# Patient Record
Sex: Female | Born: 1980 | Race: White | Hispanic: Yes | Marital: Married | Smoking: Never smoker
Health system: Southern US, Community
[De-identification: ages and names within clinical notes are randomized; demographics above are authoritative.]

## PROBLEM LIST (undated history)

## (undated) ENCOUNTER — Emergency Department (HOSPITAL_COMMUNITY): Payer: Medicaid Other

## (undated) DIAGNOSIS — K3580 Unspecified acute appendicitis: Secondary | ICD-10-CM

## (undated) DIAGNOSIS — O24419 Gestational diabetes mellitus in pregnancy, unspecified control: Secondary | ICD-10-CM

## (undated) HISTORY — PX: APPENDECTOMY: SHX54

## (undated) HISTORY — DX: Gestational diabetes mellitus in pregnancy, unspecified control: O24.419

---

## 2001-09-19 ENCOUNTER — Emergency Department (HOSPITAL_COMMUNITY): Admission: EM | Admit: 2001-09-19 | Discharge: 2001-09-19 | Payer: Self-pay | Admitting: Emergency Medicine

## 2002-08-19 ENCOUNTER — Other Ambulatory Visit: Admission: RE | Admit: 2002-08-19 | Discharge: 2002-08-19 | Payer: Self-pay | Admitting: Obstetrics and Gynecology

## 2002-08-25 ENCOUNTER — Encounter: Payer: Self-pay | Admitting: *Deleted

## 2002-08-25 ENCOUNTER — Ambulatory Visit (HOSPITAL_COMMUNITY): Admission: RE | Admit: 2002-08-25 | Discharge: 2002-08-25 | Payer: Self-pay | Admitting: *Deleted

## 2002-12-08 ENCOUNTER — Inpatient Hospital Stay (HOSPITAL_COMMUNITY): Admission: AD | Admit: 2002-12-08 | Discharge: 2002-12-10 | Payer: Self-pay | Admitting: *Deleted

## 2005-06-27 ENCOUNTER — Ambulatory Visit (HOSPITAL_COMMUNITY): Admission: RE | Admit: 2005-06-27 | Discharge: 2005-06-27 | Payer: Self-pay | Admitting: *Deleted

## 2005-10-30 ENCOUNTER — Inpatient Hospital Stay (HOSPITAL_COMMUNITY): Admission: AD | Admit: 2005-10-30 | Discharge: 2005-10-30 | Payer: Self-pay | Admitting: *Deleted

## 2005-10-30 ENCOUNTER — Ambulatory Visit: Payer: Self-pay | Admitting: Family Medicine

## 2005-10-31 ENCOUNTER — Ambulatory Visit: Payer: Self-pay | Admitting: Gynecology

## 2005-10-31 ENCOUNTER — Inpatient Hospital Stay (HOSPITAL_COMMUNITY): Admission: AD | Admit: 2005-10-31 | Discharge: 2005-11-02 | Payer: Self-pay | Admitting: Family Medicine

## 2008-11-03 ENCOUNTER — Emergency Department (HOSPITAL_COMMUNITY): Admission: EM | Admit: 2008-11-03 | Discharge: 2008-11-05 | Payer: Self-pay | Admitting: Emergency Medicine

## 2010-02-26 NOTE — L&D Delivery Note (Signed)
Delivery Note At 11:49 AM a viable female was delivered via  (Presentation: ;  ).  APGAR: , ; weight .   Placenta status: , .  Cord:  with the following complications: .  Cord pH: not done  Anesthesia:   Episiotomy:  Lacerations:  Suture Repair: 2.0 Est. Blood Loss (mL):   Mom to postpartum.  Baby to nursery-stable.  MARSHALL,BERNARD A 02/02/2011, 11:58 AM

## 2010-06-02 LAB — CBC
Platelets: 224 10*3/uL (ref 150–400)
WBC: 15 10*3/uL — ABNORMAL HIGH (ref 4.0–10.5)

## 2010-06-02 LAB — URINE MICROSCOPIC-ADD ON

## 2010-06-02 LAB — COMPREHENSIVE METABOLIC PANEL
AST: 32 U/L (ref 0–37)
Albumin: 4.4 g/dL (ref 3.5–5.2)
Alkaline Phosphatase: 61 U/L (ref 39–117)
Chloride: 106 mEq/L (ref 96–112)
Creatinine, Ser: 0.63 mg/dL (ref 0.4–1.2)
GFR calc Af Amer: >60 mL/min (ref >60)
Potassium: 3.8 mEq/L (ref 3.5–5.1)
Total Bilirubin: 0.8 mg/dL (ref 0.3–1.2)

## 2010-06-02 LAB — DIFFERENTIAL
Basophils Absolute: 0.1 10*3/uL (ref 0.0–0.1)
Eosinophils Relative: 0 % (ref 0–5)
Lymphocytes Relative: 6 % — ABNORMAL LOW (ref 12–46)
Monocytes Absolute: 0.3 10*3/uL (ref 0.1–1.0)

## 2010-06-02 LAB — URINALYSIS, ROUTINE W REFLEX MICROSCOPIC
Ketones, ur: >80 mg/dL — AB
Nitrite: NEGATIVE
Specific Gravity, Urine: 1.023 (ref 1.005–1.030)

## 2010-06-02 LAB — POCT PREGNANCY, URINE: Preg Test, Ur: NEGATIVE

## 2010-06-14 ENCOUNTER — Inpatient Hospital Stay (HOSPITAL_COMMUNITY): Payer: Self-pay

## 2010-06-14 ENCOUNTER — Inpatient Hospital Stay (HOSPITAL_COMMUNITY)
Admission: AD | Admit: 2010-06-14 | Discharge: 2010-06-14 | Disposition: A | Discharge status: Home / Self Care | Payer: Self-pay | Source: Ambulatory Visit | Attending: Obstetrics & Gynecology | Admitting: Obstetrics & Gynecology

## 2010-06-14 ENCOUNTER — Encounter (HOSPITAL_COMMUNITY): Payer: Self-pay | Admitting: Radiology

## 2010-06-14 DIAGNOSIS — O209 Hemorrhage in early pregnancy, unspecified: Secondary | ICD-10-CM

## 2010-06-14 LAB — URINALYSIS, ROUTINE W REFLEX MICROSCOPIC
Glucose, UA: NEGATIVE mg/dL
Protein, ur: NEGATIVE mg/dL
Specific Gravity, Urine: <1.005 — ABNORMAL LOW (ref 1.005–1.030)
pH: 7 (ref 5.0–8.0)

## 2010-06-14 LAB — WET PREP, GENITAL
Clue Cells Wet Prep HPF POC: NONE SEEN
Trich, Wet Prep: NONE SEEN
Yeast Wet Prep HPF POC: NONE SEEN

## 2010-06-14 LAB — URINE MICROSCOPIC-ADD ON

## 2010-06-14 LAB — CBC
Platelets: 225 10*3/uL (ref 150–400)
RBC: 4.35 MIL/uL (ref 3.87–5.11)
WBC: 9.3 10*3/uL (ref 4.0–10.5)

## 2010-09-08 LAB — RPR: RPR: NONREACTIVE

## 2010-09-08 LAB — HEPATITIS B SURFACE ANTIGEN: Hepatitis B Surface Ag: NEGATIVE

## 2010-09-08 LAB — RUBELLA ANTIBODY, IGM: Rubella: IMMUNE

## 2011-02-02 ENCOUNTER — Encounter (HOSPITAL_COMMUNITY): Payer: Self-pay | Admitting: *Deleted

## 2011-02-02 ENCOUNTER — Inpatient Hospital Stay (HOSPITAL_COMMUNITY)
Admission: AD | Admit: 2011-02-02 | Discharge: 2011-02-04 | DRG: 775 | Disposition: A | Discharge status: Home / Self Care | Payer: Medicaid Other | Source: Ambulatory Visit | Attending: Obstetrics | Admitting: Obstetrics

## 2011-02-02 ENCOUNTER — Encounter (HOSPITAL_COMMUNITY): Payer: Self-pay

## 2011-02-02 ENCOUNTER — Encounter (HOSPITAL_COMMUNITY): Payer: Self-pay | Admitting: Anesthesiology

## 2011-02-02 ENCOUNTER — Telehealth (HOSPITAL_COMMUNITY): Payer: Self-pay | Admitting: *Deleted

## 2011-02-02 ENCOUNTER — Inpatient Hospital Stay (HOSPITAL_COMMUNITY): Admission: RE | Admit: 2011-02-02 | Payer: Self-pay | Source: Ambulatory Visit

## 2011-02-02 ENCOUNTER — Inpatient Hospital Stay (HOSPITAL_COMMUNITY): Payer: Medicaid Other | Admitting: Anesthesiology

## 2011-02-02 LAB — RPR: RPR Ser Ql: NONREACTIVE

## 2011-02-02 LAB — COMPREHENSIVE METABOLIC PANEL
ALT: 17 U/L (ref 0–35)
AST: 22 U/L (ref 0–37)
Albumin: 2.7 g/dL — ABNORMAL LOW (ref 3.5–5.2)
CO2: 20 mEq/L (ref 19–32)
Calcium: 9.1 mg/dL (ref 8.4–10.5)
GFR calc non Af Amer: >90 mL/min (ref >90)
Sodium: 137 mEq/L (ref 135–145)
Total Protein: 6.4 g/dL (ref 6.0–8.3)

## 2011-02-02 LAB — CBC
Hemoglobin: 11.6 g/dL — ABNORMAL LOW (ref 12.0–15.0)
MCHC: 32.3 g/dL (ref 30.0–36.0)
Platelets: 234 10*3/uL (ref 150–400)

## 2011-02-02 MED ORDER — LACTATED RINGERS IV SOLN
500.0000 mL | INTRAVENOUS | Status: DC | PRN
Start: 2011-02-02 — End: 2011-02-02

## 2011-02-02 MED ORDER — LANOLIN HYDROUS EX OINT: TOPICAL_OINTMENT | CUTANEOUS | Status: DC | PRN

## 2011-02-02 MED ORDER — LIDOCAINE HCL (PF) 1 % IJ SOLN
INTRAMUSCULAR | Status: AC
  Filled 2011-02-02: qty 30

## 2011-02-02 MED ORDER — SODIUM CHLORIDE 0.9 % IV SOLN
2.0000 g | Freq: Once | INTRAVENOUS | Status: DC
  Filled 2011-02-02: qty 2000

## 2011-02-02 MED ORDER — BENZOCAINE-MENTHOL 20-0.5 % EX AERO: 1.0000 "application " | INHALATION_SPRAY | CUTANEOUS | Status: DC | PRN

## 2011-02-02 MED ORDER — INFLUENZA VIRUS VACC SPLIT PF IM SUSP
0.5000 mL | INTRAMUSCULAR | Status: AC
  Administered 2011-02-03: 0.5 mL via INTRAMUSCULAR
  Filled 2011-02-02: qty 0.5

## 2011-02-02 MED ORDER — OXYTOCIN 20 UNITS IN LACTATED RINGERS INFUSION - SIMPLE
125.0000 mL/h | Freq: Once | INTRAVENOUS | Status: AC
  Administered 2011-02-02: 125 mL/h via INTRAVENOUS

## 2011-02-02 MED ORDER — PHENYLEPHRINE 40 MCG/ML (10ML) SYRINGE FOR IV PUSH (FOR BLOOD PRESSURE SUPPORT)
80.0000 ug | PREFILLED_SYRINGE | INTRAVENOUS | Status: DC | PRN
  Filled 2011-02-02: qty 5

## 2011-02-02 MED ORDER — OXYCODONE-ACETAMINOPHEN 5-325 MG PO TABS
1.0000 | ORAL_TABLET | ORAL | Status: DC | PRN
  Administered 2011-02-03 (×2): 2 via ORAL
  Filled 2011-02-02 (×2): qty 2

## 2011-02-02 MED ORDER — FENTANYL 2.5 MCG/ML BUPIVACAINE 1/10 % EPIDURAL INFUSION (WH - ANES)
14.0000 mL/h | INTRAMUSCULAR | Status: DC
  Filled 2011-02-02: qty 60

## 2011-02-02 MED ORDER — EPHEDRINE 5 MG/ML INJ
10.0000 mg | INTRAVENOUS | Status: DC | PRN
  Filled 2011-02-02: qty 4

## 2011-02-02 MED ORDER — ONDANSETRON HCL 4 MG PO TABS: 4.0000 mg | ORAL_TABLET | ORAL | Status: DC | PRN

## 2011-02-02 MED ORDER — OXYTOCIN BOLUS FROM INFUSION
500.0000 mL | Freq: Once | INTRAVENOUS | Status: DC
  Filled 2011-02-02: qty 500

## 2011-02-02 MED ORDER — ZOLPIDEM TARTRATE 5 MG PO TABS: 5.0000 mg | ORAL_TABLET | Freq: Every evening | ORAL | Status: DC | PRN

## 2011-02-02 MED ORDER — DIBUCAINE 1 % RE OINT: 1.0000 "application " | TOPICAL_OINTMENT | RECTAL | Status: DC | PRN

## 2011-02-02 MED ORDER — WITCH HAZEL-GLYCERIN EX PADS
1.0000 "application " | MEDICATED_PAD | CUTANEOUS | Status: DC | PRN
  Administered 2011-02-02: 1 via TOPICAL

## 2011-02-02 MED ORDER — LIDOCAINE HCL 1.5 % IJ SOLN
INTRAMUSCULAR | Status: DC | PRN
  Administered 2011-02-02 (×2): 5 mL via EPIDURAL

## 2011-02-02 MED ORDER — SENNOSIDES-DOCUSATE SODIUM 8.6-50 MG PO TABS
2.0000 | ORAL_TABLET | Freq: Every day | ORAL | Status: DC
  Administered 2011-02-02 – 2011-02-03 (×2): 2 via ORAL

## 2011-02-02 MED ORDER — LIDOCAINE HCL (PF) 1 % IJ SOLN: 30.0000 mL | INTRAMUSCULAR | Status: DC | PRN

## 2011-02-02 MED ORDER — PRENATAL PLUS 27-1 MG PO TABS
1.0000 | ORAL_TABLET | Freq: Every day | ORAL | Status: DC
  Administered 2011-02-02 – 2011-02-04 (×3): 1 via ORAL
  Filled 2011-02-02 (×3): qty 1

## 2011-02-02 MED ORDER — SIMETHICONE 80 MG PO CHEW: 80.0000 mg | CHEWABLE_TABLET | ORAL | Status: DC | PRN

## 2011-02-02 MED ORDER — CITRIC ACID-SODIUM CITRATE 334-500 MG/5ML PO SOLN: 30.0000 mL | ORAL | Status: DC | PRN

## 2011-02-02 MED ORDER — SODIUM CHLORIDE 0.9 % IV SOLN: 250.0000 mL | INTRAVENOUS | Status: DC | PRN

## 2011-02-02 MED ORDER — LACTATED RINGERS IV SOLN: 500.0000 mL | Freq: Once | INTRAVENOUS | Status: DC

## 2011-02-02 MED ORDER — LACTATED RINGERS IV SOLN: INTRAVENOUS | Status: DC

## 2011-02-02 MED ORDER — CLINDAMYCIN PHOSPHATE 900 MG/50ML IV SOLN: 900.0000 mg | Freq: Once | INTRAVENOUS | Status: DC

## 2011-02-02 MED ORDER — SODIUM CHLORIDE 0.9 % IJ SOLN: 3.0000 mL | INTRAMUSCULAR | Status: DC | PRN

## 2011-02-02 MED ORDER — ONDANSETRON HCL 4 MG/2ML IJ SOLN: 4.0000 mg | Freq: Four times a day (QID) | INTRAMUSCULAR | Status: DC | PRN

## 2011-02-02 MED ORDER — FERROUS SULFATE 325 (65 FE) MG PO TABS
325.0000 mg | ORAL_TABLET | Freq: Two times a day (BID) | ORAL | Status: DC
  Administered 2011-02-02 – 2011-02-04 (×4): 325 mg via ORAL
  Filled 2011-02-02 (×4): qty 1

## 2011-02-02 MED ORDER — TETANUS-DIPHTH-ACELL PERTUSSIS 5-2.5-18.5 LF-MCG/0.5 IM SUSP
0.5000 mL | Freq: Once | INTRAMUSCULAR | Status: AC
  Administered 2011-02-03: 0.5 mL via INTRAMUSCULAR
  Filled 2011-02-02: qty 0.5

## 2011-02-02 MED ORDER — EPHEDRINE 5 MG/ML INJ: 10.0000 mg | INTRAVENOUS | Status: DC | PRN

## 2011-02-02 MED ORDER — DIPHENHYDRAMINE HCL 50 MG/ML IJ SOLN: 12.5000 mg | INTRAMUSCULAR | Status: DC | PRN

## 2011-02-02 MED ORDER — IBUPROFEN 600 MG PO TABS: 600.0000 mg | ORAL_TABLET | Freq: Four times a day (QID) | ORAL | Status: DC | PRN

## 2011-02-02 MED ORDER — OXYCODONE-ACETAMINOPHEN 5-325 MG PO TABS: 2.0000 | ORAL_TABLET | ORAL | Status: DC | PRN

## 2011-02-02 MED ORDER — ACETAMINOPHEN 325 MG PO TABS: 650.0000 mg | ORAL_TABLET | ORAL | Status: DC | PRN

## 2011-02-02 MED ORDER — IBUPROFEN 600 MG PO TABS
600.0000 mg | ORAL_TABLET | Freq: Four times a day (QID) | ORAL | Status: DC
  Administered 2011-02-02 – 2011-02-04 (×7): 600 mg via ORAL
  Filled 2011-02-02 (×7): qty 1

## 2011-02-02 MED ORDER — DIPHENHYDRAMINE HCL 25 MG PO CAPS: 25.0000 mg | ORAL_CAPSULE | Freq: Four times a day (QID) | ORAL | Status: DC | PRN

## 2011-02-02 MED ORDER — ONDANSETRON HCL 4 MG/2ML IJ SOLN: 4.0000 mg | INTRAMUSCULAR | Status: DC | PRN

## 2011-02-02 MED ORDER — FLEET ENEMA 7-19 GM/118ML RE ENEM: 1.0000 | ENEMA | RECTAL | Status: DC | PRN

## 2011-02-02 MED ORDER — OXYTOCIN 20 UNITS IN LACTATED RINGERS INFUSION - SIMPLE
INTRAVENOUS | Status: AC
  Filled 2011-02-02: qty 1000

## 2011-02-02 MED ORDER — SODIUM CHLORIDE 0.9 % IJ SOLN: 3.0000 mL | Freq: Two times a day (BID) | INTRAMUSCULAR | Status: DC

## 2011-02-02 MED ORDER — PHENYLEPHRINE 40 MCG/ML (10ML) SYRINGE FOR IV PUSH (FOR BLOOD PRESSURE SUPPORT): 80.0000 ug | PREFILLED_SYRINGE | INTRAVENOUS | Status: DC | PRN

## 2011-02-02 MED ORDER — FENTANYL 2.5 MCG/ML BUPIVACAINE 1/10 % EPIDURAL INFUSION (WH - ANES)
INTRAMUSCULAR | Status: DC | PRN
  Administered 2011-02-02: 14 mL/h via EPIDURAL

## 2011-02-02 NOTE — Progress Notes (Signed)
[facility] Department of Clinical Social Work Documentation of Interpretation   I assisted __Dr Hatchett_________________ with interpretation of _epidural_____________________ for this patient.

## 2011-02-02 NOTE — Progress Notes (Signed)
UR chart review completed.  

## 2011-02-02 NOTE — H&P (Signed)
This is Dr. Francoise Ceo dictating the history and physical on  Suzanne Estes    she's a 30 year old gravida 3 para 2002 at 40 weeks and 1 day her EDC is 02/01/2011 her GBS was negative shows oh positive and she was admitted 9 cm dilated membranes were ruptured artificially and the fluid was clear the patient is to push she got an epidural and subsequently decided to push and had a normal vaginal delivery of a female Apgar 8 and 9 by the resident the placenta was spontaneous Past medical history negative Past surgical history negative Social history negative System review negative Physical exam Ms. A well-developed female in postpartum in no distress HEENT negative Lungs clear Breasts negative Heart regular with no murmurs no gallops Abdomen uterus 20 week post  Partum Pelvic deferred Extremities negative and lacerations

## 2011-02-02 NOTE — Telephone Encounter (Signed)
Preadmission screen  

## 2011-02-02 NOTE — Anesthesia Procedure Notes (Signed)
Epidural Patient location during procedure: OB Start time: 02/02/2011 10:30 AM End time: 02/02/2011 10:35 AM Reason for block: procedure for pain  Staffing Anesthesiologist: Sandrea Hughs Performed by: anesthesiologist   Preanesthetic Checklist Completed: patient identified, site marked, surgical consent, pre-op evaluation, timeout performed, IV checked, risks and benefits discussed and monitors and equipment checked  Epidural Patient position: sitting Prep: site prepped and draped and DuraPrep Patient monitoring: continuous pulse ox and blood pressure Approach: midline Injection technique: LOR air  Needle:  Needle type: Tuohy  Needle gauge: 17 G Needle length: 9 cm Needle insertion depth: 5 cm cm Catheter type: closed end flexible Catheter size: 19 Gauge Catheter at skin depth: 10 cm Test dose: negative and 1.5% lidocaine  Assessment Sensory level: T8 Events: blood not aspirated, injection not painful, no injection resistance, negative IV test and no paresthesia

## 2011-02-02 NOTE — Anesthesia Preprocedure Evaluation (Signed)

## 2011-02-03 LAB — CBC
HCT: 29.3 % — ABNORMAL LOW (ref 36.0–46.0)
MCV: 85.7 fL (ref 78.0–100.0)
Platelets: 190 10*3/uL (ref 150–400)
RBC: 3.42 MIL/uL — ABNORMAL LOW (ref 3.87–5.11)
WBC: 11.4 10*3/uL — ABNORMAL HIGH (ref 4.0–10.5)

## 2011-02-03 NOTE — Anesthesia Postprocedure Evaluation (Signed)
  Anesthesia Post-op Note  Patient: Suzanne Estes  Procedure(s) Performed: * No procedures listed *  Patient Location: mother baby  Anesthesia Type: Epidural  Level of Consciousness: awake, alert  and oriented  Airway and Oxygen Therapy: Patient Spontanous Breathing  Post-op Pain: mild  Post-op Assessment: Patient's Cardiovascular Status Stable, Respiratory Function Stable, Patent Airway, No signs of Nausea or vomiting and Pain level controlled  Post-op Vital Signs: stable  Complications: No apparent anesthesia complications

## 2011-02-03 NOTE — Progress Notes (Signed)
Patient ID: Suzanne Estes, female   DOB: [birth year], 30 y.o.   MRN: [id number] Postpartum day one Bile signs normal Fundus firm Legs negative No complaints

## 2011-02-04 NOTE — Discharge Summary (Signed)
Obstetric Discharge Summary Reason for Admission: onset of labor Prenatal Procedures: none Intrapartum Procedures: spontaneous vaginal delivery Postpartum Procedures: none Complications-Operative and Postpartum: none Hemoglobin  Date Value Range Status  02/03/2011 9.7* 12.0-15.0 (g/dL) Final     HCT  Date Value Range Status  02/03/2011 29.3* 36.0-46.0 (%) Final    Discharge Diagnoses: Term Pregnancy-delivered  Discharge Information: Date: 02/04/2011 Activity: pelvic rest Diet: routine Medications: None Condition: stable Instructions: refer to practice specific booklet Discharge to: home Follow-up Information    Follow up with Alesandro Stueve A, MD. Call in 6 weeks.   Contact information:   [address] [place] [address] [phone]          Newborn Data: Live born female  Birth Weight: 5 lb 12.6 oz (2625 g) APGAR: 7, 9  Home with motherand.  Daegen Berrocal A 02/04/2011, 6:27 AMand and

## 2013-12-28 ENCOUNTER — Encounter (HOSPITAL_COMMUNITY): Payer: Self-pay | Admitting: *Deleted

## 2014-02-23 ENCOUNTER — Ambulatory Visit (INDEPENDENT_AMBULATORY_CARE_PROVIDER_SITE_OTHER): Payer: Self-pay | Admitting: Family Medicine

## 2014-02-23 VITALS — BP 116/74 | HR 78 | Temp 98.2°F | Resp 16 | Ht 59.5 in | Wt 127.6 lb

## 2014-02-23 DIAGNOSIS — L309 Dermatitis, unspecified: Secondary | ICD-10-CM

## 2014-02-23 DIAGNOSIS — L509 Urticaria, unspecified: Secondary | ICD-10-CM

## 2014-02-23 MED ORDER — METHYLPREDNISOLONE ACETATE 80 MG/ML IJ SUSP
120.0000 mg | Freq: Once | INTRAMUSCULAR | Status: AC
  Administered 2014-02-23: 120 mg via INTRAMUSCULAR

## 2014-02-23 MED ORDER — TRIAMCINOLONE ACETONIDE 0.1 % EX CREA: 1.0000 "application " | TOPICAL_CREAM | Freq: Two times a day (BID) | CUTANEOUS | Status: DC

## 2014-02-23 NOTE — Progress Notes (Signed)
Subjective: 33 year old lady who speaks limited English but her husband and children help her. She has been having problems over the last month with a rash. It occurs as scattered macular areas on the arms, trunk, and back of the neck. She says she has had them on her face. She does not have them below the waist. They burn and itch some. She has a history of what sounds like an episode of hives some years ago for which she got a shot of cortisone and some Benadryl. She is a homemaker, not exposed to anything that she knows of. None of the children or husband have any rashes. She had her last menstrual cycle just recently, and no risk of pregnancy.  Objective: Macular scattered erythematous lesions around the trunk and arms. Soma been scratched open, but none have any pustular or target-looking areas. Most are fairly dry in appearance.  Assessment: Atypical dermatitis, possibly urticarial  Plan: Depo-Medrol 120 Zyrtec   triamcinolone cream  Return if worse

## 2014-02-23 NOTE — Addendum Note (Signed)
Addended by: Maryann AlarBURNS, Dariela Stoker M on: 02/23/2014 05:04 PM   Modules accepted: Orders

## 2014-02-23 NOTE — Patient Instructions (Signed)
Use the cream on the individual red spots twice daily  Take over-the-counter Zyrtec (cetirizine) 10 mg daily at bedtime  If the rash is not improving over the next week or 10 days please return

## 2014-05-17 ENCOUNTER — Observation Stay (HOSPITAL_COMMUNITY)
Admission: EM | Admit: 2014-05-17 | Discharge: 2014-05-19 | Disposition: A | Discharge status: Home / Self Care | Payer: Medicaid Other | Attending: General Surgery | Admitting: General Surgery

## 2014-05-17 ENCOUNTER — Encounter (HOSPITAL_COMMUNITY): Payer: Self-pay | Admitting: *Deleted

## 2014-05-17 DIAGNOSIS — K3580 Unspecified acute appendicitis: Secondary | ICD-10-CM | POA: Diagnosis not present

## 2014-05-17 DIAGNOSIS — Z809 Family history of malignant neoplasm, unspecified: Secondary | ICD-10-CM | POA: Diagnosis not present

## 2014-05-17 DIAGNOSIS — R1084 Generalized abdominal pain: Secondary | ICD-10-CM

## 2014-05-17 DIAGNOSIS — K352 Acute appendicitis with generalized peritonitis, without abscess: Secondary | ICD-10-CM

## 2014-05-17 HISTORY — DX: Unspecified acute appendicitis: K35.80

## 2014-05-17 LAB — CBC WITH DIFFERENTIAL/PLATELET
Basophils Absolute: 0 10*3/uL (ref 0.0–0.1)
Basophils Relative: 0 % (ref 0–1)
EOS ABS: 0 10*3/uL (ref 0.0–0.7)
EOS PCT: 0 % (ref 0–5)
HEMATOCRIT: 39.1 % (ref 36.0–46.0)
HEMOGLOBIN: 13.4 g/dL (ref 12.0–15.0)
LYMPHS ABS: 1.1 10*3/uL (ref 0.7–4.0)
Lymphocytes Relative: 8 % — ABNORMAL LOW (ref 12–46)
MCH: 30.9 pg (ref 26.0–34.0)
MCHC: 34.3 g/dL (ref 30.0–36.0)
MCV: 90.3 fL (ref 78.0–100.0)
MONOS PCT: 2 % — AB (ref 3–12)
Monocytes Absolute: 0.3 10*3/uL (ref 0.1–1.0)
Neutro Abs: 12.4 10*3/uL — ABNORMAL HIGH (ref 1.7–7.7)
Neutrophils Relative %: 90 % — ABNORMAL HIGH (ref 43–77)
Platelets: 284 10*3/uL (ref 150–400)
RBC: 4.33 MIL/uL (ref 3.87–5.11)
RDW: 12.8 % (ref 11.5–15.5)
WBC: 13.9 10*3/uL — ABNORMAL HIGH (ref 4.0–10.5)

## 2014-05-17 LAB — COMPREHENSIVE METABOLIC PANEL
ALBUMIN: 4.5 g/dL (ref 3.5–5.2)
ALT: 15 U/L (ref 0–35)
ANION GAP: 8 (ref 5–15)
AST: 21 U/L (ref 0–37)
Alkaline Phosphatase: 69 U/L (ref 39–117)
BILIRUBIN TOTAL: 0.7 mg/dL (ref 0.3–1.2)
CALCIUM: 9.5 mg/dL (ref 8.4–10.5)
CHLORIDE: 105 mmol/L (ref 96–112)
CO2: 26 mmol/L (ref 19–32)
CREATININE: 0.54 mg/dL (ref 0.50–1.10)
GFR calc Af Amer: >90 mL/min (ref >90)
GFR calc non Af Amer: >90 mL/min (ref >90)
Glucose, Bld: 132 mg/dL — ABNORMAL HIGH (ref 70–99)
Potassium: 3.9 mmol/L (ref 3.5–5.1)
Sodium: 139 mmol/L (ref 135–145)
TOTAL PROTEIN: 7 g/dL (ref 6.0–8.3)

## 2014-05-17 LAB — URINALYSIS, ROUTINE W REFLEX MICROSCOPIC
Bilirubin Urine: NEGATIVE
GLUCOSE, UA: NEGATIVE mg/dL
KETONES UR: 40 mg/dL — AB
Nitrite: NEGATIVE
PH: 8 (ref 5.0–8.0)
Protein, ur: NEGATIVE mg/dL
SPECIFIC GRAVITY, URINE: 1.022 (ref 1.005–1.030)
Urobilinogen, UA: 0.2 mg/dL (ref 0.0–1.0)

## 2014-05-17 LAB — URINE MICROSCOPIC-ADD ON

## 2014-05-17 LAB — POC URINE PREG, ED: Preg Test, Ur: NEGATIVE

## 2014-05-17 MED ORDER — ONDANSETRON HCL 4 MG/2ML IJ SOLN
4.0000 mg | Freq: Once | INTRAMUSCULAR | Status: AC
  Administered 2014-05-17: 4 mg via INTRAVENOUS
  Filled 2014-05-17: qty 2

## 2014-05-17 MED ORDER — OXYCODONE-ACETAMINOPHEN 5-325 MG PO TABS
1.0000 | ORAL_TABLET | Freq: Once | ORAL | Status: AC
  Administered 2014-05-17: 1 via ORAL
  Filled 2014-05-17: qty 1

## 2014-05-17 MED ORDER — MORPHINE SULFATE 4 MG/ML IJ SOLN
4.0000 mg | Freq: Once | INTRAMUSCULAR | Status: AC
  Administered 2014-05-17: 4 mg via INTRAVENOUS
  Filled 2014-05-17: qty 1

## 2014-05-17 MED ORDER — ONDANSETRON 4 MG PO TBDP
ORAL_TABLET | ORAL | Status: AC
  Filled 2014-05-17: qty 2

## 2014-05-17 MED ORDER — ONDANSETRON 4 MG PO TBDP
8.0000 mg | ORAL_TABLET | Freq: Once | ORAL | Status: AC
  Administered 2014-05-17: 8 mg via ORAL

## 2014-05-17 MED ORDER — IOHEXOL 300 MG/ML  SOLN
25.0000 mL | Freq: Once | INTRAMUSCULAR | Status: AC | PRN
  Administered 2014-05-17: 25 mL via ORAL

## 2014-05-17 MED ORDER — SODIUM CHLORIDE 0.9 % IV BOLUS (SEPSIS)
1000.0000 mL | Freq: Once | INTRAVENOUS | Status: AC
  Administered 2014-05-17: 1000 mL via INTRAVENOUS

## 2014-05-17 NOTE — ED Notes (Signed)
Pt in c/o abd pain that radiates into her back, also n/v/d that started today, pt tearful in triage, denies other symptoms

## 2014-05-17 NOTE — ED Provider Notes (Signed)
CSN: [id number]     Arrival date & time 05/17/14  1918 History   First MD Initiated Contact with Patient 05/17/14 2222     Chief Complaint  Patient presents with  . Abdominal Pain   Suzanne Estes is a 34 y.o. spanish speaking female who presents to the emergency department complaining of nausea, vomiting, diarrhea and abdominal pain since yesterday. She reports that yesterday her symptoms began with abdominal pain and nausea progressed to having vomiting and diarrhea. She reports vomiting 7-8 times today and having 6-7 episodes of diarrhea. She currently rates her abdominal pain at 9/10 and describes it as sharp. Her pelvic pain is generalized for abdomen and worse in her right lower quadrant. She reports it is sometimes better with vomiting. She also reports some associated dysuria today. The patient denies sick contacts or previous abdominal surgeries. The patient denies fevers, chills, hematemesis, hematochezia, hematuria, urinary frequency, urinary urgency, cough, vaginal bleeding, vaginal discharge or rashes.  (Consider location/radiation/quality/duration/timing/severity/associated sxs/prior Treatment) Patient is a 34 y.o. female presenting with abdominal pain. The history is provided by the patient. A language interpreter was used.  Abdominal Pain Associated symptoms: diarrhea, dysuria, nausea and vomiting   Associated symptoms: no chest pain, no chills, no cough, no fever, no hematuria, no shortness of breath, no sore throat, no vaginal bleeding and no vaginal discharge     History reviewed. No pertinent past medical history. History reviewed. No pertinent past surgical history. Family History  Problem Relation Age of Onset  . Anesthesia problems Neg Hx   . Cancer Father    History  Substance Use Topics  . Smoking status: Never Smoker   . Smokeless tobacco: Never Used  . Alcohol Use: No   OB History    Gravida Para Term Preterm AB TAB SAB Ectopic Multiple Living   Review of Systems  Constitutional: Negative for fever and chills.  HENT: Negative for congestion and sore throat.   Eyes: Negative for visual disturbance.  Respiratory: Negative for cough, shortness of breath and wheezing.   Cardiovascular: Negative for chest pain and palpitations.  Gastrointestinal: Positive for nausea, vomiting, abdominal pain and diarrhea. Negative for blood in stool.  Genitourinary: Positive for dysuria. Negative for urgency, frequency, hematuria, flank pain, vaginal bleeding, vaginal discharge, difficulty urinating, genital sores and pelvic pain.  Musculoskeletal: Negative for back pain and neck pain.  Skin: Negative for rash.  Neurological: Negative for headaches.      Allergies  Review of patient's allergies indicates no known allergies.  Home Medications   Prior to Admission medications   Medication Sig Start Date End Date Taking? Authorizing Provider  diphenhydrAMINE (SOMINEX) 25 MG tablet Take 25 mg by mouth at bedtime as needed for sleep.   Yes Historical Provider, MD  triamcinolone cream (KENALOG) 0.1 % Apply 1 application topically 2 (two) times daily. Patient not taking: Reported on 05/17/2014 02/23/14   Peyton Najjar, MD   BP 130/72 mmHg  Pulse 111  Temp(Src) 99.7 F (37.6 C) (Oral)  Resp 15  SpO2 100%  LMP 04/27/2014 (Approximate) Physical Exam  Constitutional: She appears well-developed and well-nourished. No distress.  HENT:  Head: Normocephalic and atraumatic.  Mouth/Throat: Oropharynx is clear and moist. No oropharyngeal exudate.  Eyes: Conjunctivae are normal. Pupils are equal, round, and reactive to light. Right eye exhibits no discharge. Left eye exhibits no discharge.  Neck: Neck supple.  Cardiovascular: Normal rate, regular  rhythm, normal heart sounds and intact distal pulses.  Exam reveals no gallop and no friction rub.   No murmur heard. Pulmonary/Chest: Effort normal and breath sounds normal. No respiratory  distress. She has no wheezes. She has no rales.  Abdominal: Soft. Bowel sounds are normal. She exhibits no distension. There is tenderness.  Abdomen soft. Bowel sounds are present. Right lower quadrant tenderness to palpation. No rebound tenderness. Negative psoas and obturator.   Musculoskeletal: She exhibits no edema.  Lymphadenopathy:    She has no cervical adenopathy.  Neurological: She is alert. Coordination normal.  Skin: Skin is warm and dry. No rash noted. She is not diaphoretic. No erythema. No pallor.  Psychiatric: She has a normal mood and affect. Her behavior is normal.  Nursing note and vitals reviewed.   ED Course  Procedures (including critical care time) Labs Review Labs Reviewed  CBC WITH DIFFERENTIAL/PLATELET - Abnormal; Notable for the following:    WBC 13.9 (*)    Neutrophils Relative % 90 (*)    Neutro Abs 12.4 (*)    Lymphocytes Relative 8 (*)    Monocytes Relative 2 (*)    All other components within normal limits  COMPREHENSIVE METABOLIC PANEL - Abnormal; Notable for the following:    Glucose, Bld 132 (*)    BUN <5 (*)    All other components within normal limits  URINALYSIS, ROUTINE W REFLEX MICROSCOPIC - Abnormal; Notable for the following:    APPearance TURBID (*)    Hgb urine dipstick SMALL (*)    Ketones, ur 40 (*)    Leukocytes, UA MODERATE (*)    All other components within normal limits  URINE MICROSCOPIC-ADD ON - Abnormal; Notable for the following:    Squamous Epithelial / LPF FEW (*)    Bacteria, UA FEW (*)    All other components within normal limits  POC URINE PREG, ED    Imaging Review Ct Abdomen Pelvis W Contrast  05/18/2014   CLINICAL DATA:  Acute onset of generalized abdominal pain, radiating to the back. Nausea and vomiting. Initial encounter.  EXAM: CT ABDOMEN AND PELVIS WITH CONTRAST  TECHNIQUE: Multidetector CT imaging of the abdomen and pelvis was performed using the standard protocol following bolus administration of intravenous  contrast.  CONTRAST:  100mL OMNIPAQUE IOHEXOL 300 MG/ML  SOLN  COMPARISON:  Pelvic ultrasound performed 06/14/2010, and abdominal ultrasound performed 11/03/2008  FINDINGS: The visualized lung bases are clear.  The liver and spleen are unremarkable in appearance. The gallbladder is within normal limits. The pancreas and adrenal glands are unremarkable.  The kidneys are unremarkable in appearance. There is no evidence of hydronephrosis. No renal or ureteral stones are seen. No perinephric stranding is appreciated.  No free fluid is identified. The small bowel is unremarkable in appearance. The stomach is within normal limits. No acute vascular abnormalities are seen.  The appendix is dilated to 1.5 cm in maximal diameter, with surrounding soft tissue inflammation and trace fluid. An appendicolith is noted at the base of the appendix, measuring 0.9 cm. The appendix is partially retrocecal in nature.  The bladder is mildly distended and grossly unremarkable. The uterus is grossly unremarkable in appearance. The ovaries are relatively symmetric. No suspicious adnexal masses are seen. No inguinal lymphadenopathy is seen.  No acute osseous abnormalities are identified. Likely developmental disc space narrowing is noted at L5-S1.  IMPRESSION: Acute appendicitis noted, with dilatation of the appendix to 1.5 cm in maximal diameter, surrounding soft tissue inflammation and trace fluid.  No evidence of perforation or abscess formation at this time. 0.9 cm at appendicolith noted at the base of the appendix. The appendix is partially retrocecal in nature. No evidence of perforation or abscess formation at this time.   Electronically Signed   By: Roanna Raider M.D.   On: 05/18/2014 01:23     EKG Interpretation None      Filed Vitals:   05/17/14 2330 05/18/14 0000 05/18/14 0030 05/18/14 0151  BP: 137/73 140/71 120/68 130/72  Pulse: 70 100 98 111  Temp:    99.7 F (37.6 C)  TempSrc:    Oral  Resp:    15  SpO2: 97%  99% 99% 100%     MDM   Meds given in ED:  Medications  enoxaparin (LOVENOX) injection 40 mg (not administered)  dextrose 5 % in lactated ringers infusion (not administered)  cefOXitin (MEFOXIN) 1 g in dextrose 5 % 50 mL IVPB (not administered)  HYDROmorphone (DILAUDID) injection 1 mg (not administered)  acetaminophen (TYLENOL) tablet 650 mg (not administered)    Or  acetaminophen (TYLENOL) suppository 650 mg (not administered)  ondansetron (ZOFRAN) injection 4 mg (not administered)  oxyCODONE-acetaminophen (PERCOCET/ROXICET) 5-325 MG per tablet 1 tablet (1 tablet Oral Given 05/17/14 2005)  ondansetron (ZOFRAN-ODT) disintegrating tablet 8 mg (8 mg Oral Given 05/17/14 2000)  sodium chloride 0.9 % bolus 1,000 mL (0 mLs Intravenous Stopped 05/18/14 0150)  ondansetron (ZOFRAN) injection 4 mg (4 mg Intravenous Given 05/17/14 2349)  morphine 4 MG/ML injection 4 mg (4 mg Intravenous Given 05/17/14 2349)  iohexol (OMNIPAQUE) 300 MG/ML solution 25 mL (25 mLs Oral Contrast Given 05/17/14 2325)  iohexol (OMNIPAQUE) 300 MG/ML solution 100 mL (100 mLs Intravenous Contrast Given 05/18/14 0045)  morphine 4 MG/ML injection 4 mg (4 mg Intravenous Given 05/18/14 0152)    New Prescriptions   No medications on file    Final diagnoses:  Acute appendicitis with generalized peritonitis   This is a 34 y.o. spanish speaking female who presents to the emergency department complaining of nausea, vomiting, diarrhea and abdominal pain since yesterday. She reports that yesterday her symptoms began with abdominal pain and nausea progressed to having vomiting and diarrhea. She denies previous abdominal surgeries. The patient has right lower quadrant tenderness to palpation. Will obtain CT scan.  She has and elevated white count at 13.9. CMP is unremarkable. CT scan indicates acute appendicitis. I consulted with general surgeon Dr. Luisa Hart, who reports he will be down to see the patient. Patient admitted. I advised the  patient of the results and she understands and is in agreement with the plan.       Everlene Farrier, PA-C 05/18/14 4098  Rolan Bucco, MD 05/18/14 1311

## 2014-05-17 NOTE — ED Notes (Signed)
Pt unsure of LMP, unsure if she could be pregnant, pregnancy test ordered before medications will be started

## 2014-05-18 ENCOUNTER — Observation Stay (HOSPITAL_COMMUNITY): Payer: Medicaid Other | Admitting: Certified Registered Nurse Anesthetist

## 2014-05-18 ENCOUNTER — Emergency Department (HOSPITAL_COMMUNITY): Payer: Medicaid Other

## 2014-05-18 ENCOUNTER — Other Ambulatory Visit (HOSPITAL_COMMUNITY): Payer: Self-pay

## 2014-05-18 ENCOUNTER — Encounter (HOSPITAL_COMMUNITY)
Admission: EM | Disposition: A | Discharge status: Home / Self Care | Payer: Self-pay | Source: Home / Self Care | Attending: Emergency Medicine

## 2014-05-18 ENCOUNTER — Encounter (HOSPITAL_COMMUNITY): Payer: Self-pay

## 2014-05-18 DIAGNOSIS — K3580 Unspecified acute appendicitis: Secondary | ICD-10-CM | POA: Diagnosis present

## 2014-05-18 DIAGNOSIS — Z809 Family history of malignant neoplasm, unspecified: Secondary | ICD-10-CM | POA: Diagnosis not present

## 2014-05-18 HISTORY — PX: LAPAROSCOPIC APPENDECTOMY: SHX408

## 2014-05-18 HISTORY — PX: LAPAROSCOPIC APPENDECTOMY: SUR753

## 2014-05-18 HISTORY — DX: Unspecified acute appendicitis: K35.80

## 2014-05-18 LAB — SURGICAL PCR SCREEN
MRSA, PCR: NEGATIVE
Staphylococcus aureus: NEGATIVE

## 2014-05-18 SURGERY — APPENDECTOMY, LAPAROSCOPIC
Anesthesia: General | Site: Abdomen

## 2014-05-18 MED ORDER — SUCCINYLCHOLINE CHLORIDE 20 MG/ML IJ SOLN
INTRAMUSCULAR | Status: DC | PRN
  Administered 2014-05-18: 100 mg via INTRAVENOUS

## 2014-05-18 MED ORDER — ROCURONIUM BROMIDE 100 MG/10ML IV SOLN
INTRAVENOUS | Status: DC | PRN
  Administered 2014-05-18: 20 mg via INTRAVENOUS

## 2014-05-18 MED ORDER — OXYCODONE-ACETAMINOPHEN 5-325 MG PO TABS
1.0000 | ORAL_TABLET | ORAL | Status: DC | PRN
  Administered 2014-05-18 – 2014-05-19 (×2): 1 via ORAL
  Filled 2014-05-18 (×2): qty 1

## 2014-05-18 MED ORDER — ENOXAPARIN SODIUM 40 MG/0.4ML ~~LOC~~ SOLN: 40.0000 mg | SUBCUTANEOUS | Status: DC

## 2014-05-18 MED ORDER — LACTATED RINGERS IV SOLN
INTRAVENOUS | Status: DC
  Administered 2014-05-18: 09:00:00 via INTRAVENOUS

## 2014-05-18 MED ORDER — GLYCOPYRROLATE 0.2 MG/ML IJ SOLN
INTRAMUSCULAR | Status: DC | PRN
  Administered 2014-05-18: 0.4 mg via INTRAVENOUS

## 2014-05-18 MED ORDER — MIDAZOLAM HCL 5 MG/5ML IJ SOLN
INTRAMUSCULAR | Status: DC | PRN
  Administered 2014-05-18: 2 mg via INTRAVENOUS

## 2014-05-18 MED ORDER — BUPIVACAINE HCL 0.25 % IJ SOLN
INTRAMUSCULAR | Status: DC | PRN
  Administered 2014-05-18: 30 mL

## 2014-05-18 MED ORDER — DEXAMETHASONE SODIUM PHOSPHATE 10 MG/ML IJ SOLN
INTRAMUSCULAR | Status: AC
  Filled 2014-05-18: qty 2

## 2014-05-18 MED ORDER — ACETAMINOPHEN 650 MG RE SUPP: 650.0000 mg | Freq: Four times a day (QID) | RECTAL | Status: DC | PRN

## 2014-05-18 MED ORDER — IOHEXOL 300 MG/ML  SOLN
100.0000 mL | Freq: Once | INTRAMUSCULAR | Status: AC | PRN
  Administered 2014-05-18: 100 mL via INTRAVENOUS

## 2014-05-18 MED ORDER — HYDROMORPHONE HCL 1 MG/ML IJ SOLN
1.0000 mg | INTRAMUSCULAR | Status: DC | PRN
  Administered 2014-05-18: 1 mg via INTRAVENOUS
  Filled 2014-05-18: qty 1

## 2014-05-18 MED ORDER — ONDANSETRON HCL 4 MG/2ML IJ SOLN
INTRAMUSCULAR | Status: DC | PRN
  Administered 2014-05-18: 4 mg via INTRAVENOUS

## 2014-05-18 MED ORDER — LACTATED RINGERS IV SOLN
INTRAVENOUS | Status: DC | PRN
  Administered 2014-05-18: 09:00:00 via INTRAVENOUS

## 2014-05-18 MED ORDER — PROPOFOL 10 MG/ML IV BOLUS
INTRAVENOUS | Status: AC
  Filled 2014-05-18: qty 20

## 2014-05-18 MED ORDER — BUPIVACAINE HCL (PF) 0.25 % IJ SOLN
INTRAMUSCULAR | Status: AC
  Filled 2014-05-18: qty 30

## 2014-05-18 MED ORDER — FENTANYL CITRATE 0.05 MG/ML IJ SOLN
INTRAMUSCULAR | Status: AC
  Filled 2014-05-18: qty 2

## 2014-05-18 MED ORDER — ONDANSETRON HCL 4 MG/2ML IJ SOLN: 4.0000 mg | Freq: Four times a day (QID) | INTRAMUSCULAR | Status: DC | PRN

## 2014-05-18 MED ORDER — ACETAMINOPHEN 325 MG PO TABS: 650.0000 mg | ORAL_TABLET | Freq: Four times a day (QID) | ORAL | Status: DC | PRN

## 2014-05-18 MED ORDER — KETOROLAC TROMETHAMINE 30 MG/ML IJ SOLN
30.0000 mg | Freq: Once | INTRAMUSCULAR | Status: AC | PRN
  Administered 2014-05-18: 30 mg via INTRAVENOUS

## 2014-05-18 MED ORDER — ENOXAPARIN SODIUM 40 MG/0.4ML ~~LOC~~ SOLN
40.0000 mg | SUBCUTANEOUS | Status: DC
  Administered 2014-05-19: 40 mg via SUBCUTANEOUS
  Filled 2014-05-18: qty 0.4

## 2014-05-18 MED ORDER — FENTANYL CITRATE 0.05 MG/ML IJ SOLN
25.0000 ug | INTRAMUSCULAR | Status: DC | PRN
  Administered 2014-05-18: 25 ug via INTRAVENOUS

## 2014-05-18 MED ORDER — MIDAZOLAM HCL 2 MG/2ML IJ SOLN
INTRAMUSCULAR | Status: AC
  Filled 2014-05-18: qty 2

## 2014-05-18 MED ORDER — DEXAMETHASONE SODIUM PHOSPHATE 10 MG/ML IJ SOLN
INTRAMUSCULAR | Status: DC | PRN
  Administered 2014-05-18: 10 mg via INTRAVENOUS

## 2014-05-18 MED ORDER — PROMETHAZINE HCL 25 MG/ML IJ SOLN: 6.2500 mg | INTRAMUSCULAR | Status: DC | PRN

## 2014-05-18 MED ORDER — NEOSTIGMINE METHYLSULFATE 10 MG/10ML IV SOLN
INTRAVENOUS | Status: DC | PRN
  Administered 2014-05-18: 3 mg via INTRAVENOUS

## 2014-05-18 MED ORDER — PHENYLEPHRINE HCL 10 MG/ML IJ SOLN
INTRAMUSCULAR | Status: DC | PRN
  Administered 2014-05-18 (×6): 80 ug via INTRAVENOUS

## 2014-05-18 MED ORDER — PROPOFOL 10 MG/ML IV BOLUS
INTRAVENOUS | Status: DC | PRN
  Administered 2014-05-18: 100 mg via INTRAVENOUS

## 2014-05-18 MED ORDER — ONDANSETRON HCL 4 MG/2ML IJ SOLN
INTRAMUSCULAR | Status: AC
  Filled 2014-05-18: qty 2

## 2014-05-18 MED ORDER — DEXTROSE 5 % IV SOLN
1.0000 g | Freq: Four times a day (QID) | INTRAVENOUS | Status: DC
  Administered 2014-05-18: 1 g via INTRAVENOUS
  Filled 2014-05-18 (×3): qty 1

## 2014-05-18 MED ORDER — ROCURONIUM BROMIDE 50 MG/5ML IV SOLN
INTRAVENOUS | Status: AC
  Filled 2014-05-18: qty 1

## 2014-05-18 MED ORDER — 0.9 % SODIUM CHLORIDE (POUR BTL) OPTIME
TOPICAL | Status: DC | PRN
  Administered 2014-05-18: 1000 mL

## 2014-05-18 MED ORDER — KETOROLAC TROMETHAMINE 30 MG/ML IJ SOLN
INTRAMUSCULAR | Status: AC
  Filled 2014-05-18: qty 1

## 2014-05-18 MED ORDER — LIDOCAINE HCL (CARDIAC) 20 MG/ML IV SOLN
INTRAVENOUS | Status: DC | PRN
  Administered 2014-05-18: 100 mg via INTRAVENOUS

## 2014-05-18 MED ORDER — LIDOCAINE HCL (CARDIAC) 20 MG/ML IV SOLN
INTRAVENOUS | Status: AC
  Filled 2014-05-18: qty 5

## 2014-05-18 MED ORDER — SODIUM CHLORIDE 0.9 % IR SOLN
Status: DC | PRN
Start: 2014-05-18 — End: 2014-05-18
  Administered 2014-05-18: 1000 mL

## 2014-05-18 MED ORDER — DEXTROSE 5 % IV SOLN
1.0000 g | INTRAVENOUS | Status: AC
  Administered 2014-05-18: 1 g via INTRAVENOUS
  Filled 2014-05-18: qty 1

## 2014-05-18 MED ORDER — DEXTROSE IN LACTATED RINGERS 5 % IV SOLN
INTRAVENOUS | Status: DC
Start: 2014-05-18 — End: 2014-05-19
  Administered 2014-05-18 – 2014-05-19 (×3): via INTRAVENOUS

## 2014-05-18 MED ORDER — FENTANYL CITRATE 0.05 MG/ML IJ SOLN
INTRAMUSCULAR | Status: DC | PRN
  Administered 2014-05-18 (×2): 50 ug via INTRAVENOUS

## 2014-05-18 MED ORDER — FENTANYL CITRATE 0.05 MG/ML IJ SOLN
INTRAMUSCULAR | Status: AC
  Filled 2014-05-18: qty 5

## 2014-05-18 MED ORDER — MORPHINE SULFATE 4 MG/ML IJ SOLN
4.0000 mg | Freq: Once | INTRAMUSCULAR | Status: AC
Start: 2014-05-18 — End: 2014-05-18
  Administered 2014-05-18: 4 mg via INTRAVENOUS
  Filled 2014-05-18: qty 1

## 2014-05-18 SURGICAL SUPPLY — 50 items
APL SKNCLS STERI-STRIP NONHPOA (GAUZE/BANDAGES/DRESSINGS) ×1
APPLIER CLIP 5 13 M/L LIGAMAX5 (MISCELLANEOUS)
APR CLP MED LRG 5 ANG JAW (MISCELLANEOUS)
BENZOIN TINCTURE PRP APPL 2/3 (GAUZE/BANDAGES/DRESSINGS) ×3 IMPLANT
BLADE SURG ROTATE 9660 (MISCELLANEOUS) IMPLANT
CANISTER SUCTION 2500CC (MISCELLANEOUS) ×3 IMPLANT
CHLORAPREP W/TINT 26ML (MISCELLANEOUS) ×3 IMPLANT
CLIP APPLIE 5 13 M/L LIGAMAX5 (MISCELLANEOUS) IMPLANT
CLOSURE WOUND 1/2 X4 (GAUZE/BANDAGES/DRESSINGS) ×1
COVER SURGICAL LIGHT HANDLE (MISCELLANEOUS) ×3 IMPLANT
COVER TRANSDUCER ULTRASND (DRAPES) ×3 IMPLANT
DEVICE TROCAR PUNCTURE CLOSURE (ENDOMECHANICALS) ×3 IMPLANT
DRAPE LAPAROSCOPIC ABDOMINAL (DRAPES) ×3 IMPLANT
ELECT REM PT RETURN 9FT ADLT (ELECTROSURGICAL) ×3
ELECTRODE REM PT RTRN 9FT ADLT (ELECTROSURGICAL) ×1 IMPLANT
ENDOLOOP SUT PDS II  0 18 (SUTURE) ×6
ENDOLOOP SUT PDS II 0 18 (SUTURE) ×3 IMPLANT
GAUZE SPONGE 2X2 8PLY STRL LF (GAUZE/BANDAGES/DRESSINGS) IMPLANT
GLOVE BIO SURGEON STRL SZ7 (GLOVE) ×2 IMPLANT
GLOVE BIO SURGEON STRL SZ7.5 (GLOVE) ×5 IMPLANT
GLOVE BIOGEL PI IND STRL 7.0 (GLOVE) IMPLANT
GLOVE BIOGEL PI INDICATOR 7.0 (GLOVE) ×8
GLOVE SURG SS PI 7.0 STRL IVOR (GLOVE) ×4 IMPLANT
GOWN STRL REUS W/ TWL LRG LVL3 (GOWN DISPOSABLE) ×2 IMPLANT
GOWN STRL REUS W/ TWL XL LVL3 (GOWN DISPOSABLE) ×1 IMPLANT
GOWN STRL REUS W/TWL LRG LVL3 (GOWN DISPOSABLE) ×6
GOWN STRL REUS W/TWL XL LVL3 (GOWN DISPOSABLE) ×3
KIT BASIN OR (CUSTOM PROCEDURE TRAY) ×3 IMPLANT
KIT ROOM TURNOVER OR (KITS) ×3 IMPLANT
NDL INSUFFLATION 14GA 120MM (NEEDLE) ×1 IMPLANT
NEEDLE INSUFFLATION 14GA 120MM (NEEDLE) ×3 IMPLANT
NS IRRIG 1000ML POUR BTL (IV SOLUTION) ×3 IMPLANT
PAD ARMBOARD 7.5X6 YLW CONV (MISCELLANEOUS) ×6 IMPLANT
SCISSORS LAP 5X35 DISP (ENDOMECHANICALS) ×3 IMPLANT
SET IRRIG TUBING LAPAROSCOPIC (IRRIGATION / IRRIGATOR) ×3 IMPLANT
SLEEVE ENDOPATH XCEL 5M (ENDOMECHANICALS) ×3 IMPLANT
SPECIMEN JAR SMALL (MISCELLANEOUS) ×3 IMPLANT
SPONGE GAUZE 2X2 STER 10/PKG (GAUZE/BANDAGES/DRESSINGS) ×2
STRIP CLOSURE SKIN 1/2X4 (GAUZE/BANDAGES/DRESSINGS) ×1 IMPLANT
SUT MNCRL AB 3-0 PS2 18 (SUTURE) ×3 IMPLANT
SUT SILK 2 0 SH (SUTURE) ×6 IMPLANT
SUT VIC AB 1 BRD 54 (SUTURE) IMPLANT
TAPE CLOTH SURG 4X10 WHT LF (GAUZE/BANDAGES/DRESSINGS) ×2 IMPLANT
TOWEL OR 17X24 6PK STRL BLUE (TOWEL DISPOSABLE) ×3 IMPLANT
TOWEL OR 17X26 10 PK STRL BLUE (TOWEL DISPOSABLE) ×3 IMPLANT
TRAY FOLEY CATH 16FR SILVER (SET/KITS/TRAYS/PACK) ×3 IMPLANT
TRAY LAPAROSCOPIC (CUSTOM PROCEDURE TRAY) ×3 IMPLANT
TROCAR XCEL NON-BLD 11X100MML (ENDOMECHANICALS) ×3 IMPLANT
TROCAR XCEL NON-BLD 5MMX100MML (ENDOMECHANICALS) ×3 IMPLANT
TUBING INSUFFLATION (TUBING) ×3 IMPLANT

## 2014-05-18 NOTE — Op Note (Signed)
05/18/2014  10:07 AM  PATIENT:  Suzanne Estes  34 y.o. female  PRE-OPERATIVE DIAGNOSIS:  Acute Appendicitis  POST-OPERATIVE DIAGNOSIS:  Acute Appendicitis  PROCEDURE:  Procedure(s): APPENDECTOMY LAPAROSCOPIC (N/A)  SURGEON:  Surgeon(s) and Role:    * Suzanne FillerArmando Rishon Thilges, MD - Primary  ANESTHESIA:   local and general  EBL:   <5cc  BLOOD ADMINISTERED:none  DRAINS: none   LOCAL MEDICATIONS USED:  BUPIVICAINE   SPECIMEN:  Source of Specimen:  appendix  DISPOSITION OF SPECIMEN:  PATHOLOGY  COUNTS:  YES  TOURNIQUET:  * No tourniquets in log *  DICTATION: .Dragon Dictation Findings:  The patient had a acutely inflamed nonperforated appendix  Specimen: Appendix  Indications for procedure:  The patient is a 34 year old female with a history of periumbilical pain localized in the right lower quadrant patient had a CT scan which revealed signs consistent with acute appendicitis the patient back in for laparoscopic appendectomy.  Details of the procedure:  The patient was taken back to the operating room. The patient was placed in supine position with bilateral SCDs in place. The patient was prepped and draped in the usual sterile fashion.  After appropriate anitbiotics were confirmed, a time-out was confirmed and all facts were verified.  A pneumoperitoneum of 14 mmHg was obtained via a Veress needle technique in the left lower quadrant quadrant.  A 5 mm trocar and 5 mm camera then placed intra-abdominally there is no injury to any intra-abdominal organs a 10 mm infraumbilical port was placed and direct visualization as was a 5 mm port in the suprapubic area. The appendix was identified  The appendix identified and cleaned down to the appendiceal base. The appendiceal artery was taken with Bovie cautery maintaining hemostasis, the mesoappendix was then incised.  The the appendiceal base was clean.  At this time an Endoloop was placed proximallyx2 and one distally and the  appendix was transected between these 2. The an Endo Catch bag was then placed into the abdomen and the specimen placed in Endo Catch bag. The bag and specimen were removed from the abdomen. The appendiceal stump was cauterized. We evacuate the fluid from the pelvis until the effluent was clear. The omentum was brought over the appendiceal stump. The appendix a latex retrieval  bag was then retrieved via the supraumbilical port. #1 Vicryl was used to reapproximate the fascia at the umbilical port site x2. The skin was reapproximated all port sites 3-0 Monocryl subcuticular fashion. The skin was dressed with Steri-Strips gauze and tape. The patient was awakened from general anesthesia was taken to recovery room in stable condition.       PLAN OF CARE: Admit for overnight observation  PATIENT DISPOSITION:  PACU - hemodynamically stable.   Delay start of Pharmacological VTE agent (>24hrs) due to surgical blood loss or risk of bleeding: not applicable

## 2014-05-18 NOTE — ED Notes (Signed)
Report attempted 

## 2014-05-18 NOTE — Transfer of Care (Signed)
Immediate Anesthesia Transfer of Care Note  Patient: Suzanne Estes  Procedure(s) Performed: Procedure(s): APPENDECTOMY LAPAROSCOPIC (N/A)  Patient Location: PACU  Anesthesia Type:General  Level of Consciousness: awake, alert  and oriented  Airway & Oxygen Therapy: Patient Spontanous Breathing and Patient connected to nasal cannula oxygen  Post-op Assessment: Report given to RN and Post -op Vital signs reviewed and stable  Post vital signs: Reviewed and stable  Last Vitals:  Filed Vitals:   05/18/14 0533  BP: 121/73  Pulse: 111  Temp: 37.2 C  Resp: 17    Complications: No apparent anesthesia complications

## 2014-05-18 NOTE — Anesthesia Postprocedure Evaluation (Signed)
  Anesthesia Post-op Note  Patient: Suzanne Estes  Procedure(s) Performed: Procedure(s) (LRB): APPENDECTOMY LAPAROSCOPIC (N/A)  Patient Location: PACU  Anesthesia Type: General  Level of Consciousness: awake and alert   Airway and Oxygen Therapy: Patient Spontanous Breathing  Post-op Pain: mild  Post-op Assessment: Post-op Vital signs reviewed, Patient's Cardiovascular Status Stable, Respiratory Function Stable, Patent Airway and No signs of Nausea or vomiting  Last Vitals:  Filed Vitals:   05/18/14 1025  BP: 106/62  Pulse: 106  Temp: 36.1 C  Resp: 15    Post-op Vital Signs: stable   Complications: No apparent anesthesia complications

## 2014-05-18 NOTE — H&P (Signed)
Suzanne Estes is an 34 y.o. female.   Chief Complaint: abdominal pain HPI: pt with 1 day hx RLQ abdominal pain N/V.  CT shows appendicitis.   History reviewed. No pertinent past medical history.  History reviewed. No pertinent past surgical history.  Family History  Problem Relation Age of Onset  . Anesthesia problems Neg Hx   . Cancer Father    Social History:  reports that she has never smoked. She has never used smokeless tobacco. She reports that she does not drink alcohol or use illicit drugs.  Allergies: No Known Allergies   (Not in a hospital admission)  Results for orders placed or performed during the hospital encounter of 05/17/14 (from the past 48 hour(s))  CBC WITH DIFFERENTIAL     Status: Abnormal   Collection Time: 05/17/14  7:44 PM  Result Value Ref Range   WBC 13.9 (H) 4.0 - 10.5 K/uL   RBC 4.33 3.87 - 5.11 MIL/uL   Hemoglobin 13.4 12.0 - 15.0 g/dL   HCT 39.1 36.0 - 46.0 %   MCV 90.3 78.0 - 100.0 fL   MCH 30.9 26.0 - 34.0 pg   MCHC 34.3 30.0 - 36.0 g/dL   RDW 12.8 11.5 - 15.5 %   Platelets 284 150 - 400 K/uL   Neutrophils Relative % 90 (H) 43 - 77 %   Neutro Abs 12.4 (H) 1.7 - 7.7 K/uL   Lymphocytes Relative 8 (L) 12 - 46 %   Lymphs Abs 1.1 0.7 - 4.0 K/uL   Monocytes Relative 2 (L) 3 - 12 %   Monocytes Absolute 0.3 0.1 - 1.0 K/uL   Eosinophils Relative 0 0 - 5 %   Eosinophils Absolute 0.0 0.0 - 0.7 K/uL   Basophils Relative 0 0 - 1 %   Basophils Absolute 0.0 0.0 - 0.1 K/uL  Comprehensive metabolic panel     Status: Abnormal   Collection Time: 05/17/14  7:44 PM  Result Value Ref Range   Sodium 139 135 - 145 mmol/L   Potassium 3.9 3.5 - 5.1 mmol/L   Chloride 105 96 - 112 mmol/L   CO2 26 19 - 32 mmol/L   Glucose, Bld 132 (H) 70 - 99 mg/dL   BUN <5 (L) 6 - 23 mg/dL   Creatinine, Ser 0.54 0.50 - 1.10 mg/dL   Calcium 9.5 8.4 - 10.5 mg/dL   Total Protein 7.0 6.0 - 8.3 g/dL   Albumin 4.5 3.5 - 5.2 g/dL   AST 21 0 - 37 U/L   ALT 15 0 - 35 U/L    Alkaline Phosphatase 69 39 - 117 U/L   Total Bilirubin 0.7 0.3 - 1.2 mg/dL   GFR calc non Af Amer >90 >90 mL/min   GFR calc Af Amer >90 >90 mL/min    Comment: (NOTE) The eGFR has been calculated using the CKD EPI equation. This calculation has not been validated in all clinical situations. eGFR's persistently <90 mL/min signify possible Chronic Kidney Disease.    Anion gap 8 5 - 15  Urinalysis with microscopic     Status: Abnormal   Collection Time: 05/17/14  7:44 PM  Result Value Ref Range   Color, Urine YELLOW YELLOW   APPearance TURBID (A) CLEAR   Specific Gravity, Urine 1.022 1.005 - 1.030   pH 8.0 5.0 - 8.0   Glucose, UA NEGATIVE NEGATIVE mg/dL   Hgb urine dipstick SMALL (A) NEGATIVE   Bilirubin Urine NEGATIVE NEGATIVE   Ketones, ur 40 (A) NEGATIVE  mg/dL   Protein, ur NEGATIVE NEGATIVE mg/dL   Urobilinogen, UA 0.2 0.0 - 1.0 mg/dL   Nitrite NEGATIVE NEGATIVE   Leukocytes, UA MODERATE (A) NEGATIVE  Urine microscopic-add on     Status: Abnormal   Collection Time: 05/17/14  7:44 PM  Result Value Ref Range   Squamous Epithelial / LPF FEW (A) RARE   WBC, UA 7-10 <3 WBC/hpf   RBC / HPF 3-6 <3 RBC/hpf   Bacteria, UA FEW (A) RARE   Urine-Other MUCOUS PRESENT     Comment: AMORPHOUS URATES/PHOSPHATES  POC Urine Preg, ED (do not order at [facility])     Status: None   Collection Time: 05/17/14  7:54 PM  Result Value Ref Range   Preg Test, Ur NEGATIVE NEGATIVE    Comment:        THE SENSITIVITY OF THIS METHODOLOGY IS >24 mIU/mL    Ct Abdomen Pelvis W Contrast  05/18/2014   CLINICAL DATA:  Acute onset of generalized abdominal pain, radiating to the back. Nausea and vomiting. Initial encounter.  EXAM: CT ABDOMEN AND PELVIS WITH CONTRAST  TECHNIQUE: Multidetector CT imaging of the abdomen and pelvis was performed using the standard protocol following bolus administration of intravenous contrast.  CONTRAST:  144mL OMNIPAQUE IOHEXOL 300 MG/ML  SOLN  COMPARISON:  Pelvic ultrasound  performed 06/14/2010, and abdominal ultrasound performed 11/03/2008  FINDINGS: The visualized lung bases are clear.  The liver and spleen are unremarkable in appearance. The gallbladder is within normal limits. The pancreas and adrenal glands are unremarkable.  The kidneys are unremarkable in appearance. There is no evidence of hydronephrosis. No renal or ureteral stones are seen. No perinephric stranding is appreciated.  No free fluid is identified. The small bowel is unremarkable in appearance. The stomach is within normal limits. No acute vascular abnormalities are seen.  The appendix is dilated to 1.5 cm in maximal diameter, with surrounding soft tissue inflammation and trace fluid. An appendicolith is noted at the base of the appendix, measuring 0.9 cm. The appendix is partially retrocecal in nature.  The bladder is mildly distended and grossly unremarkable. The uterus is grossly unremarkable in appearance. The ovaries are relatively symmetric. No suspicious adnexal masses are seen. No inguinal lymphadenopathy is seen.  No acute osseous abnormalities are identified. Likely developmental disc space narrowing is noted at L5-S1.  IMPRESSION: Acute appendicitis noted, with dilatation of the appendix to 1.5 cm in maximal diameter, surrounding soft tissue inflammation and trace fluid. No evidence of perforation or abscess formation at this time. 0.9 cm at appendicolith noted at the base of the appendix. The appendix is partially retrocecal in nature. No evidence of perforation or abscess formation at this time.   Electronically Signed   By: Garald Balding M.D.   On: 05/18/2014 01:23    Review of Systems  Constitutional: Positive for malaise/fatigue. Negative for fever and chills.  HENT: Negative.   Gastrointestinal: Positive for nausea, vomiting and abdominal pain.  Genitourinary: Negative.   Musculoskeletal: Negative.     Blood pressure 130/72, pulse 111, temperature 99.7 F (37.6 C), temperature source  Oral, resp. rate 15, last menstrual period 04/27/2014, SpO2 100 %, unknown if currently breastfeeding. Physical Exam  Constitutional: She appears well-developed and well-nourished.  HENT:  Head: Normocephalic and atraumatic.  Eyes: Pupils are equal, round, and reactive to light.  Neck: Normal range of motion. Neck supple.  Cardiovascular: Normal rate and regular rhythm.   Respiratory: Effort normal and breath sounds normal.  GI: Soft. There is  tenderness in the right lower quadrant. There is tenderness at McBurney's point.  Skin: Skin is warm and dry.     Assessment/Plan Acute appendicitis  Admit/IV ABX Laparoscopic appendectomy per Dr Rosendo Gros later today  Francis Doenges A. 05/18/2014, 1:55 AM

## 2014-05-18 NOTE — Anesthesia Procedure Notes (Signed)
Procedure Name: Intubation Date/Time: 05/18/2014 9:28 AM Performed by: Dairl PonderJIANG, Jinnifer Montejano Pre-anesthesia Checklist: Patient identified, Timeout performed, Emergency Drugs available, Suction available and Patient being monitored Patient Re-evaluated:Patient Re-evaluated prior to inductionOxygen Delivery Method: Circle system utilized Preoxygenation: Pre-oxygenation with 100% oxygen Intubation Type: IV induction Ventilation: Mask ventilation without difficulty Laryngoscope Size: Mac and 3 Grade View: Grade I Tube type: Oral Tube size: 7.0 mm Number of attempts: 1 Placement Confirmation: ETT inserted through vocal cords under direct vision,  breath sounds checked- equal and bilateral and positive ETCO2 Secured at: 22 cm Tube secured with: Tape Dental Injury: Teeth and Oropharynx as per pre-operative assessment

## 2014-05-18 NOTE — Progress Notes (Signed)
UR completed 

## 2014-05-18 NOTE — Anesthesia Preprocedure Evaluation (Signed)
Anesthesia Evaluation  Patient identified by MRN, date of birth, ID band Patient awake    Reviewed: Allergy & Precautions, NPO status , Patient's Chart, lab work & pertinent test results  Airway Mallampati: II  TM Distance: >3 FB Neck ROM: Full    Dental no notable dental hx.    Pulmonary neg pulmonary ROS,  breath sounds clear to auscultation  Pulmonary exam normal       Cardiovascular negative cardio ROS  Rhythm:Regular Rate:Normal     Neuro/Psych negative neurological ROS  negative psych ROS   GI/Hepatic negative GI ROS, Neg liver ROS,   Endo/Other  negative endocrine ROS  Renal/GU negative Renal ROS  negative genitourinary   Musculoskeletal negative musculoskeletal ROS (+)   Abdominal   Peds negative pediatric ROS (+)  Hematology negative hematology ROS (+)   Anesthesia Other Findings   Reproductive/Obstetrics negative OB ROS                             Anesthesia Physical Anesthesia Plan  ASA: I  Anesthesia Plan: General   Post-op Pain Management:    Induction: Intravenous and Rapid sequence  Airway Management Planned: Oral ETT  Additional Equipment:   Intra-op Plan:   Post-operative Plan: Extubation in OR  Informed Consent: I have reviewed the patients History and Physical, chart, labs and discussed the procedure including the risks, benefits and alternatives for the proposed anesthesia with the patient or authorized representative who has indicated his/her understanding and acceptance.   Dental advisory given  Plan Discussed with: CRNA and Surgeon  Anesthesia Plan Comments:         Anesthesia Quick Evaluation

## 2014-05-19 ENCOUNTER — Encounter (HOSPITAL_COMMUNITY): Payer: Self-pay | Admitting: General Surgery

## 2014-05-19 MED ORDER — OXYCODONE-ACETAMINOPHEN 5-325 MG PO TABS: 1.0000 | ORAL_TABLET | Freq: Four times a day (QID) | ORAL | Status: DC | PRN

## 2014-05-19 NOTE — Discharge Instructions (Signed)
Your appointment is at 2:00pm, please arrive at least 30 min before your appointment to complete your check in paperwork.  If you are unable to arrive 30 min prior to your appointment time we may have to cancel or reschedule you. ° °LAPAROSCOPIC SURGERY: POST OP INSTRUCTIONS  °1. DIET: Follow a light bland diet the first 24 hours after arrival home, such as soup, liquids, crackers, etc. Be sure to include lots of fluids daily. Avoid fast food or heavy meals as your are more likely to get nauseated. Eat a low fat the next few days after surgery.  °2. Take your usually prescribed home medications unless otherwise directed. °3. PAIN CONTROL:  °1. Pain is best controlled by a usual combination of three different methods TOGETHER:  °1. Ice/Heat °2. Over the counter pain medication °3. Prescription pain medication °2. Most patients will experience some swelling and bruising around the incisions. Ice packs or heating pads (30-60 minutes up to 6 times a day) will help. Use ice for the first few days to help decrease swelling and bruising, then switch to heat to help relax tight/sore spots and speed recovery. Some people prefer to use ice alone, heat alone, alternating between ice & heat. Experiment to what works for you. Swelling and bruising can take several weeks to resolve.  °3. It is helpful to take an over-the-counter pain medication regularly for the first few weeks. Choose one of the following that works best for you:  °1. Naproxen (Aleve, etc) Two 220mg tabs twice a day °2. Ibuprofen (Advil, etc) Three 200mg tabs four times a day (every meal & bedtime) °3. Acetaminophen (Tylenol, etc) 500-650mg four times a day (every meal & bedtime) °4. A prescription for pain medication (such as oxycodone, hydrocodone, etc) should be given to you upon discharge. Take your pain medication as prescribed.  °1. If you are having problems/concerns with the prescription medicine (does not control pain, nausea, vomiting, rash, itching,  etc), please call us [phone] to see if we need to switch you to a different pain medicine that will work better for you and/or control your side effect better. °2. If you need a refill on your pain medication, please contact your pharmacy. They will contact our office to request authorization. Prescriptions will not be filled after 5 pm or on week-ends. °4. Avoid getting constipated. Between the surgery and the pain medications, it is common to experience some constipation. Increasing fluid intake and taking a fiber supplement (such as Metamucil, Citrucel, FiberCon, MiraLax, etc) 1-2 times a day regularly will usually help prevent this problem from occurring. A mild laxative (prune juice, Milk of Magnesia, MiraLax, etc) should be taken according to package directions if there are no bowel movements after 48 hours.  °5. Watch out for diarrhea. If you have many loose bowel movements, simplify your diet to bland foods & liquids for a few days. Stop any stool softeners and decrease your fiber supplement. Switching to mild anti-diarrheal medications (Kayopectate, Pepto Bismol) can help. If this worsens or does not improve, please call us. °6. Wash / shower every day. You may shower over the dressings as they are waterproof. Continue to shower over incision(s) after the dressing is off. °7. Remove your waterproof bandages 5 days after surgery. You may leave the incision open to air. You may replace a dressing/Band-Aid to cover the incision for comfort if you wish.  °8. ACTIVITIES as tolerated:  °1. You may resume regular (light) daily activities beginning the next day--such as   daily self-care, walking, climbing stairs--gradually increasing activities as tolerated. If you can walk 30 minutes without difficulty, it is safe to try more intense activity such as jogging, treadmill, bicycling, low-impact aerobics, swimming, etc. 2. Save the most intensive and strenuous activity for last such as sit-ups, heavy lifting,  contact sports, etc Refrain from any heavy lifting or straining until you are off narcotics for pain control.  3. DO NOT PUSH THROUGH PAIN. Let pain be your guide: If it hurts to do something, don't do it. Pain is your body warning you to avoid that activity for another week until the pain goes down. 4. You may drive when you are no longer taking prescription pain medication, you can comfortably wear a seatbelt, and you can safely maneuver your car and apply brakes. 5. You may have sexual intercourse when it is comfortable.  9. FOLLOW UP in our office  1. Please call [facility] at [phone] to set up an appointment to see your surgeon in the office for a follow-up appointment approximately 2-3 weeks after your surgery. 2. Make sure that you call for this appointment the day you arrive home to insure a convenient appointment time.      10. IF YOU HAVE DISABILITY OR FAMILY LEAVE FORMS, BRING THEM TO THE               OFFICE FOR PROCESSING.   WHEN TO CALL US [phone]) [phone]:  1. Poor pain control 2. Reactions / problems with new medications (rash/itching, nausea, etc)  3. Fever over 101.5 F (38.5 C) 4. Inability to urinate 5. Nausea and/or vomiting 6. Worsening swelling or bruising 7. Continued bleeding from incision. 8. Increased pain, redness, or drainage from the incision  The clinic staff is available to answer your questions during regular business hours (8:30am-5pm). Please dont hesitate to call and ask to speak to one of our nurses for clinical concerns.  If you have a medical emergency, go to the nearest emergency room or call 911.  A surgeon from [facility] is always on call at the [facility]   [facility] [place]  [address], [address], [place], [place] [address] ?  MAIN: [phone] ? TOLL FREE: [phone] ?  FAX [phone]  [email or web address]  CIRUGIA LAPAROSCOPICA: INSTRUCCIONES DE POST OPERATORIO.  Revise siempre los  documentos que le entreguen en el lugar donde se ha hecho la [place].  SI USTED NECESITA DOCUMENTOS DE INCAPACIDAD (DISABLE) O DE PERMISO FAMILAR (FAMILY LEAVE) NECESITA TRAERLOS A LA OFICINA PARA QUE SEAN PROCESADOS. NO  SE LOS DE A SU DOCTOR. 1. A su alta del hospital se le dara una receta para Human resources officercontrolar el dolor. Tomela como ha sido recetada, si la necesita. Si no la necesita puede tomar, Acetaminofen (Tylenol) o Ibuprofen (Advil) para aliviar dolor moderado. 2. Continue tomando el resto de sus medicinas. 3. Si necesita rellenar la receta, llame a la farmacia. ellos contactan a nuestra oficina pidiendo autorizacion. Este tipo de receta no pueden ser PACCAR Increllenadas despues de las  5pm o Energy Transfer Partnersdurante los fines de [place] Comunidadsemana. 4. Con relacion a la dieta: debe ser [place] Corporationligera los primeros dias despues que llege a la casa. Ejemplo: sopas y galleticas. Tome bastante liquido esos dias. 5. La mayoria de los pacientes padecen de inflamacion y cambio de coloracion de la piel alrededor de las incisiones. esto toma dias en resolver.  pnerse una bolsa de hielo en el area affectada ayuda..  6. Es comun tambien tener un poco de  estrenimiento si esta tomado medicinas para el dolor. incremente la cantidad de liquidos a tomar y Engineer, production (Colace) esto previene el problema. Si ya tiene estrenimiento, es Designer, jewellery no ha defecado en 48 horas, puede tomar un laxativo (Milk of Magnesia or Miralax) uselo como el paquete le explica. 7.  A menos que se le diga algo diferente. Remueva el bendaje a las 24-48 horas despues dela [place]. y puede banarse en la ducha sin ningun problema. usted puede tener steri-strips (pequenas curitas transparentes en la piel puesta encima de la incision)  Estas banditas strips should be left on the skin for 7-10 days.   Si su cirujano puso pegamento encima de la incision usted puede banarse bajo la ducha en 24 horas. Este pegamento empezara a caerse en las proximas 2-3 semanas. Si le pusieron suturas o presillas  (grapos) estos seran quitados en su proxima cita en la oficina. Marland Kitchen a. ACTIVIDADES:  Puede hacer actividad ligera.  Como caminar , subir escaleras y poco a poco irlas incrementando tanto como las [place]. Puede tener relaciones sexuales cuando sea comfortable. No carge objetos pesados o haga esfuerzos que no sean aprovados por su doctor. b. Puede manejar en cuanto no esta tomando medicamentos fuertes (narcoticos) para Chief Technology Officer, pueda abrochar confortablemente el cinturon de seguridad, y pueda Personnel officer y usar los pedales de su vehiculo con seguridad. c. PUEDE REGRESAR A TRABAJAR  8. Debe ver a su doctor para una cita de seguimiento en 2-3 semanas despues de la [place].  9. OTRAS ISNSTRUCCIONES:___________________________________________________________________________________ Debbora Lacrosse A SU MEDICO: 1. FIEBRE mayor de  101.0 2. No produccion de [place]. 3. Sangramiento continue de la herida 4. Incremento de dolor, enrojecimientio o drenaje de la herida (incision) 5. Incremento de dolor abdominal.  The clinic staff is available to answer your questions during regular business hours.  Please dont hesitate to call and ask to speak to one of the nurses for clinical concerns.  If you have a medical emergency, go to the nearest emergency room or call 911.  A surgeon from [facility] is always on call at the hospital. [address], [address], [place], [place]  [address] ? [address] [address], [place], [place]   [address] [phone] ? [phone] ? FAX [phone] Web site: [email or web address]

## 2014-05-19 NOTE — Progress Notes (Signed)
Interpreter Wyvonnia DuskyGraciela Namihira for RN [facility] Dis[facility] instruction

## 2014-05-19 NOTE — Discharge Summary (Signed)
[facility] Discharge Summary   Patient ID: Suzanne Estes MRN: [id number] DOB/AGE: April 17, 1980 34 y.o.  Admit date: 05/17/2014 Discharge date: 05/19/2014  Admitting Diagnosis: Acute appendicitis  Discharge Diagnosis Patient Active Problem List   Diagnosis Date Noted  . Acute appendicitis 05/18/2014    Consultants None  Imaging: Ct Abdomen Pelvis W Contrast  05/18/2014   CLINICAL DATA:  Acute onset of generalized abdominal pain, radiating to the back. Nausea and vomiting. Initial encounter.  EXAM: CT ABDOMEN AND PELVIS WITH CONTRAST  TECHNIQUE: Multidetector CT imaging of the abdomen and pelvis was performed using the standard protocol following bolus administration of intravenous contrast.  CONTRAST:  100mL OMNIPAQUE IOHEXOL 300 MG/ML  SOLN  COMPARISON:  Pelvic ultrasound performed 06/14/2010, and abdominal ultrasound performed 11/03/2008  FINDINGS: The visualized lung bases are clear.  The liver and spleen are unremarkable in appearance. The gallbladder is within normal limits. The pancreas and adrenal glands are unremarkable.  The kidneys are unremarkable in appearance. There is no evidence of hydronephrosis. No renal or ureteral stones are seen. No perinephric stranding is appreciated.  No free fluid is identified. The small bowel is unremarkable in appearance. The stomach is within normal limits. No acute vascular abnormalities are seen.  The appendix is dilated to 1.5 cm in maximal diameter, with surrounding soft tissue inflammation and trace fluid. An appendicolith is noted at the base of the appendix, measuring 0.9 cm. The appendix is partially retrocecal in nature.  The bladder is mildly distended and grossly unremarkable. The uterus is grossly unremarkable in appearance. The ovaries are relatively symmetric. No suspicious adnexal masses are seen. No inguinal lymphadenopathy is seen.  No acute osseous abnormalities are identified. Likely developmental disc  space narrowing is noted at L5-S1.  IMPRESSION: Acute appendicitis noted, with dilatation of the appendix to 1.5 cm in maximal diameter, surrounding soft tissue inflammation and trace fluid. No evidence of perforation or abscess formation at this time. 0.9 cm at appendicolith noted at the base of the appendix. The appendix is partially retrocecal in nature. No evidence of perforation or abscess formation at this time.   Electronically Signed   By: Roanna RaiderJeffery  Chang M.D.   On: 05/18/2014 01:23    Procedures Dr. Derrell Lollingamirez (05/18/14) - Laparoscopic Appendectomy   Hospital Course:  34 y/o Hispanic [facility] day hx RLQ abdominal pain N/V. CT shows appendicitis.  Patient was admitted and underwent procedure listed above.  Tolerated procedure well and was transferred to the floor.  Diet was advanced as tolerated.  On POD #1, the patient was voiding well, tolerating diet, ambulating well, pain well controlled, vital signs stable, incisions c/d/i and felt stable for discharge home.  Patient will follow up in our office in 3 weeks and knows to call with questions or concerns.   Physical Exam: General:  Alert, NAD, pleasant, comfortable Abd:  Soft, ND, mild tenderness, incisions C/D/I     Medication List    STOP taking these medications        diphenhydrAMINE 25 MG tablet  Commonly known as:  SOMINEX      TAKE these medications        oxyCODONE-acetaminophen 5-325 MG per tablet  Commonly known as:  PERCOCET/ROXICET  Take 1-2 tablets by mouth every 6 (six) hours as needed for moderate pain.         Follow-up Information    Follow up with [facility] OFFICE [place] On 06/08/2014.   Why:  For post-operation  check. Your appointment is at 2:00pm, please arrive at least 30 min before your appointment to complete your check in paperwork.  If you are unable to arrive 30 min prior to your appointment time we may have to cancel or reschedule you   Contact information:   [address] [place] [address] [phone]      Signed: Candiss Norse [facility] [phone]  05/19/2014, 9:05 AM

## 2014-10-26 ENCOUNTER — Ambulatory Visit (INDEPENDENT_AMBULATORY_CARE_PROVIDER_SITE_OTHER): Payer: Self-pay | Admitting: Family Medicine

## 2014-10-26 VITALS — BP 122/80 | HR 85 | Temp 98.3°F | Resp 18 | Ht 60.0 in | Wt 133.4 lb

## 2014-10-26 DIAGNOSIS — N912 Amenorrhea, unspecified: Secondary | ICD-10-CM

## 2014-10-26 LAB — POCT URINE PREGNANCY: Preg Test, Ur: NEGATIVE

## 2014-10-26 LAB — POCT URINALYSIS DIPSTICK
BILIRUBIN UA: NEGATIVE
Glucose, UA: NEGATIVE
KETONES UA: NEGATIVE
LEUKOCYTES UA: NEGATIVE
Nitrite, UA: NEGATIVE
Protein, UA: NEGATIVE
Spec Grav, UA: 1.01
Urobilinogen, UA: 0.2
pH, UA: 6

## 2014-10-26 LAB — POCT WET PREP WITH KOH
Clue Cells Wet Prep HPF POC: NEGATIVE
KOH Prep POC: NEGATIVE
RBC WET PREP PER HPF POC: NEGATIVE
Trichomonas, UA: NEGATIVE
YEAST WET PREP PER HPF POC: NEGATIVE

## 2014-10-26 NOTE — Progress Notes (Signed)
10/26/2014 at 9:18 PM  Suzanne Estes / DOB: [birth year] / MRN: [id number]  The patient has Acute appendicitis on her problem list.  SUBJECTIVE  Suzanne Estes is a 34 y.o. well appearing female presenting for the chief complaint of amenorrhea for two months.  States that she has taken several pregnancy test since that time and all have been negative.  She is has one sexual partner, her husband, and does not use protection or contraception.  She has never had this problem before and states that her periods have classically be every 28 to 30 days.  Denies the use of any new medications. She denies breast pain and lactorrhea.     She  has a past medical history of Appendicitis, acute (05/17/14).    Medications reviewed and updated by myself where necessary, and exist elsewhere in the encounter.   Suzanne Estes has No Known Allergies. She  reports that she has never smoked. She has never used smokeless tobacco. She reports that she does not drink alcohol or use illicit drugs. She  has no sexual activity history on file. The patient  has past surgical history that includes Laparoscopic appendectomy (05/18/14) and laparoscopic appendectomy (N/A, 05/18/2014).  Her family history includes Cancer in her father. There is no history of Anesthesia problems.  Review of Systems  Constitutional: Negative for fever and chills.  Respiratory: Negative for shortness of breath.   Cardiovascular: Negative for chest pain.  Gastrointestinal: Negative for nausea.  Genitourinary: Negative.   Skin: Negative for rash.  Neurological: Negative for dizziness and headaches.    OBJECTIVE  Her  height is 5' (1.524 m) and weight is 133 lb 6 oz (60.499 kg). Her oral temperature is 98.3 F (36.8 C). Her blood pressure is 122/80 and her pulse is 85. Her respiration is 18 and oxygen saturation is 99%.  The patient's body mass index is 26.05 kg/(m^2).  Physical Exam  Constitutional: She is  oriented to person, place, and time. She appears well-developed and well-nourished. No distress.  Eyes: Pupils are equal, round, and reactive to light.  Cardiovascular: Normal rate.   Respiratory: Effort normal and breath sounds normal. No respiratory distress.  GI: Soft. Bowel sounds are normal. She exhibits no distension.  Genitourinary: Vagina normal and uterus normal. Pelvic exam was performed with patient supine. There is no rash or tenderness on the right labia. There is no rash or tenderness on the left labia. Cervix exhibits no motion tenderness, no discharge and no friability. Right adnexum displays no mass, no tenderness and no fullness. Left adnexum displays no mass, no tenderness and no fullness.  Neurological: She is alert and oriented to person, place, and time. No cranial nerve deficit.  Skin: Skin is warm and dry. She is not diaphoretic.    Results for orders placed or performed in visit on 10/26/14 (from the past 24 hour(s))  POCT urine pregnancy     Status: Normal   Collection Time: 10/26/14  8:36 PM  Result Value Ref Range   Preg Test, Ur Negative Negative  POCT urinalysis dipstick     Status: None   Collection Time: 10/26/14  8:36 PM  Result Value Ref Range   Color, UA yellow    Clarity, UA clear    Glucose, UA negative    Bilirubin, UA negative    Ketones, UA negative    Spec Grav, UA 1.010    Blood, UA trace-intact    pH, UA 6.0    Protein, UA  negative    Urobilinogen, UA 0.2    Nitrite, UA negative    Leukocytes, UA Negative Negative  POCT Wet Prep with KOH     Status: Abnormal   Collection Time: 10/26/14  9:11 PM  Result Value Ref Range   Trichomonas, UA Negative    Clue Cells Wet Prep HPF POC Negative    Epithelial Wet Prep HPF POC Many Few, Moderate, Many   Yeast Wet Prep HPF POC Negative    Bacteria Wet Prep HPF POC Many (A) None, Few   RBC Wet Prep HPF POC Negative    WBC Wet Prep HPF POC 10-20    KOH Prep POC Negative     ASSESSMENT &  PLAN  Denisha was seen today for abdominal pain.  Diagnoses and all orders for this visit:  Amenorrhea: 34 y.o. female with no history of amenorrhea aside from pregnancy.  Her work up is negative today.  Given that it has been roughly 2 months advised it is okay to give this problem more time.  She has a follow up on October the 8th with her GYN and she should discuss this problem, if it continues, at that time.   -     POCT urine pregnancy -     POCT urinalysis dipstick -     TSH -     POCT Wet Prep with KOH    The patient was advised to call or come back to clinic if she does not see an improvement in symptoms, or worsens with the above plan.   Deliah Boston, MHS, PA-C [facility] 10/26/2014 9:18 PM

## 2014-10-27 LAB — TSH: TSH: 2.77 u[IU]/mL (ref 0.350–4.500)

## 2014-10-27 NOTE — Progress Notes (Signed)
Discussed patient with Deliah Boston, PA-C. Assessment and treatment plan agreed upon.  Peyton Najjar M.D.

## 2017-02-06 DIAGNOSIS — Z800 Family history of malignant neoplasm of digestive organs: Secondary | ICD-10-CM | POA: Insufficient documentation

## 2017-03-17 ENCOUNTER — Emergency Department (HOSPITAL_COMMUNITY)
Admission: EM | Admit: 2017-03-17 | Discharge: 2017-03-17 | Disposition: A | Discharge status: Home / Self Care | Payer: Self-pay | Attending: Emergency Medicine | Admitting: Emergency Medicine

## 2017-03-17 ENCOUNTER — Encounter (HOSPITAL_COMMUNITY): Payer: Self-pay | Admitting: Emergency Medicine

## 2017-03-17 DIAGNOSIS — L509 Urticaria, unspecified: Secondary | ICD-10-CM | POA: Insufficient documentation

## 2017-03-17 MED ORDER — FAMOTIDINE 20 MG PO TABS
20.0000 mg | ORAL_TABLET | Freq: Once | ORAL | Status: AC
  Administered 2017-03-17: 20 mg via ORAL
  Filled 2017-03-17: qty 1

## 2017-03-17 MED ORDER — METHYLPREDNISOLONE SODIUM SUCC 125 MG IJ SOLR
125.0000 mg | Freq: Once | INTRAMUSCULAR | Status: AC
  Administered 2017-03-17: 125 mg via INTRAMUSCULAR
  Filled 2017-03-17: qty 2

## 2017-03-17 MED ORDER — DIPHENHYDRAMINE HCL 25 MG PO CAPS
25.0000 mg | ORAL_CAPSULE | Freq: Once | ORAL | Status: AC
  Administered 2017-03-17: 25 mg via ORAL
  Filled 2017-03-17: qty 1

## 2017-03-17 MED ORDER — FAMOTIDINE 20 MG PO TABS: 20.0000 mg | ORAL_TABLET | Freq: Two times a day (BID) | ORAL | 0 refills | Status: DC

## 2017-03-17 MED ORDER — HYDROXYZINE HCL 25 MG PO TABS: 25.0000 mg | ORAL_TABLET | Freq: Four times a day (QID) | ORAL | 0 refills | Status: DC | PRN

## 2017-03-17 MED ORDER — PREDNISONE 10 MG PO TABS: 20.0000 mg | ORAL_TABLET | Freq: Every day | ORAL | 0 refills | Status: DC

## 2017-03-17 NOTE — ED Provider Notes (Signed)
[facility] EMERGENCY DEPARTMENT Provider Note   CSN: [id number] Arrival date & time: 03/17/17  0007     History   Chief Complaint Chief Complaint  Patient presents with  . Urticaria    HPI Suzanne Estes is a 37 y.o. female.  HPI Patient presents with diffuse itching hives covering limbs and trunk.  This started on Friday evening after eating shrimp.  She tried over-the-counter Benadryl with little relief.  States throat tightness but no intraoral swelling.  No nausea or vomiting.   Past Medical History:  Diagnosis Date  . Appendicitis, acute 05/17/14    Patient Active Problem List   Diagnosis Date Noted  . Acute appendicitis 05/18/2014    Past Surgical History:  Procedure Laterality Date  . LAPAROSCOPIC APPENDECTOMY  05/18/14   Dr. Derrell Lolling  . LAPAROSCOPIC APPENDECTOMY N/A 05/18/2014   Procedure: APPENDECTOMY LAPAROSCOPIC;  Surgeon: Axel Filler, MD;  Location: [facility] OR;  Service: General;  Laterality: N/A;    OB History    Gravida Para Term Preterm AB Living   3 3 3     3    SAB TAB Ectopic Multiple Live Births           3       Home Medications    Prior to Admission medications   Medication Sig Start Date End Date Taking? Authorizing Provider  famotidine (PEPCID) 20 MG tablet Take 1 tablet (20 mg total) by mouth 2 (two) times daily. 03/17/17   Loren Racer, MD  hydrOXYzine (ATARAX/VISTARIL) 25 MG tablet Take 1 tablet (25 mg total) by mouth every 6 (six) hours as needed for itching. 03/17/17   Loren Racer, MD  predniSONE (DELTASONE) 10 MG tablet Take 2 tablets (20 mg total) by mouth daily. 03/18/17   Loren Racer, MD    Family History Family History  Problem Relation Age of Onset  . Cancer Father   . Anesthesia problems Neg Hx     Social History Social History   Tobacco Use  . Smoking status: Never Smoker  . Smokeless tobacco: Never Used  Substance Use Topics  . Alcohol use: No  . Drug use: No      Allergies   Patient has no known allergies.   Review of Systems Review of Systems  Constitutional: Negative for chills and fever.  HENT: Negative for trouble swallowing and voice change.   Respiratory: Negative for cough, shortness of breath and wheezing.   Cardiovascular: Negative for chest pain, palpitations and leg swelling.  Gastrointestinal: Negative for abdominal pain, constipation, diarrhea, nausea and vomiting.  Musculoskeletal: Negative for back pain, myalgias, neck pain and neck stiffness.  Skin: Positive for rash. Negative for wound.  Neurological: Negative for dizziness, syncope, weakness, light-headedness, numbness and headaches.  All other systems reviewed and are negative.    Physical Exam Updated Vital Signs BP 118/78 (BP Location: Left Arm)   Pulse 82   Temp 98 F (36.7 C) (Oral)   Resp 16   LMP 02/09/2017   SpO2 100%   Physical Exam  Constitutional: She is oriented to person, place, and time. She appears well-developed and well-nourished.  HENT:  Head: Normocephalic and atraumatic.  Mouth/Throat: Oropharynx is clear and moist. No oropharyngeal exudate.  Eyes: EOM are normal. Pupils are equal, round, and reactive to light.  Neck: Normal range of motion. Neck supple.  No stridor  Cardiovascular: Normal rate and regular rhythm. Exam reveals no gallop and no friction rub.  No murmur heard. Pulmonary/Chest: Effort normal  and breath sounds normal. No stridor. No respiratory distress. She has no wheezes. She has no rales. She exhibits no tenderness.  Abdominal: Soft. Bowel sounds are normal. There is no tenderness. There is no rebound and no guarding.  Musculoskeletal: Normal range of motion. She exhibits no edema or tenderness.  Neurological: She is alert and oriented to person, place, and time.  Moving all extremities without focal deficit.  Sensation intact.  Skin: Skin is warm and dry. Capillary refill takes less than 2 seconds. Rash noted. No  erythema.  Raised erythematous plaques covering trunk and extremities.  Psychiatric: She has a normal mood and affect. Her behavior is normal.  Nursing note and vitals reviewed.    ED Treatments / Results  Labs (all labs ordered are listed, but only abnormal results are displayed) Labs Reviewed - No data to display  EKG  EKG Interpretation None       Radiology No results found.  Procedures Procedures (including critical care time)  Medications Ordered in ED Medications  methylPREDNISolone sodium succinate (SOLU-MEDROL) 125 mg/2 mL injection 125 mg (125 mg Intramuscular Given 03/17/17 0753)  diphenhydrAMINE (BENADRYL) capsule 25 mg (25 mg Oral Given 03/17/17 0754)  famotidine (PEPCID) tablet 20 mg (20 mg Oral Given 03/17/17 0754)     Initial Impression / Assessment and Plan / ED Course  I have reviewed the triage vital signs and the nursing notes.  Pertinent labs & imaging results that were available during my care of the patient were reviewed by me and considered in my medical decision making (see chart for details).     Rashe is improving.  Clear airway.  Will start patient on short course of prednisone.  Return precautions have been given.  Final Clinical Impressions(s) / ED Diagnoses   Final diagnoses:  Urticaria    ED Discharge Orders        Ordered    predniSONE (DELTASONE) 10 MG tablet  Daily     03/17/17 0944    famotidine (PEPCID) 20 MG tablet  2 times daily     03/17/17 0944    hydrOXYzine (ATARAX/VISTARIL) 25 MG tablet  Every 6 hours PRN     03/17/17 0944       Loren RacerYelverton, Evadna Donaghy, MD 03/17/17 0945

## 2017-03-17 NOTE — ED Notes (Signed)
Pt stable, ambulatory, and verbalizes understanding of d/c instructions.  

## 2017-03-17 NOTE — ED Triage Notes (Signed)
Patient reports generalized itchy skin hives onset last night unrelieved by OTC Benadryl and Cortizone cream , respirations unlabored / no oral swelling or fever .

## 2017-03-19 ENCOUNTER — Encounter: Payer: Self-pay | Admitting: Medical

## 2018-10-04 ENCOUNTER — Inpatient Hospital Stay (HOSPITAL_COMMUNITY)
Admission: AD | Admit: 2018-10-04 | Discharge: 2018-10-04 | Disposition: A | Discharge status: Home / Self Care | Payer: Self-pay | Attending: Family Medicine | Admitting: Family Medicine

## 2018-10-04 ENCOUNTER — Inpatient Hospital Stay (HOSPITAL_COMMUNITY): Payer: Self-pay

## 2018-10-04 ENCOUNTER — Other Ambulatory Visit: Payer: Self-pay

## 2018-10-04 ENCOUNTER — Encounter (HOSPITAL_COMMUNITY): Payer: Self-pay

## 2018-10-04 DIAGNOSIS — O039 Complete or unspecified spontaneous abortion without complication: Secondary | ICD-10-CM | POA: Insufficient documentation

## 2018-10-04 DIAGNOSIS — O209 Hemorrhage in early pregnancy, unspecified: Secondary | ICD-10-CM

## 2018-10-04 DIAGNOSIS — O26891 Other specified pregnancy related conditions, first trimester: Secondary | ICD-10-CM

## 2018-10-04 DIAGNOSIS — Z3A10 10 weeks gestation of pregnancy: Secondary | ICD-10-CM | POA: Insufficient documentation

## 2018-10-04 LAB — CBC
HCT: 39.9 % (ref 36.0–46.0)
Hemoglobin: 13.9 g/dL (ref 12.0–15.0)
MCH: 31.2 pg (ref 26.0–34.0)
MCHC: 34.8 g/dL (ref 30.0–36.0)
MCV: 89.7 fL (ref 80.0–100.0)
Platelets: 242 10*3/uL (ref 150–400)
RBC: 4.45 MIL/uL (ref 3.87–5.11)
RDW: 11.9 % (ref 11.5–15.5)
WBC: 7.7 10*3/uL (ref 4.0–10.5)
nRBC: 0 % (ref 0.0–0.2)

## 2018-10-04 LAB — URINALYSIS, ROUTINE W REFLEX MICROSCOPIC
Bilirubin Urine: NEGATIVE
Glucose, UA: 50 mg/dL — AB
Ketones, ur: NEGATIVE mg/dL
Leukocytes,Ua: NEGATIVE
Nitrite: NEGATIVE
Protein, ur: 30 mg/dL — AB
Specific Gravity, Urine: 1.003 — ABNORMAL LOW (ref 1.005–1.030)
pH: 6 (ref 5.0–8.0)

## 2018-10-04 LAB — WET PREP, GENITAL
Clue Cells Wet Prep HPF POC: NONE SEEN
Sperm: NONE SEEN
Trich, Wet Prep: NONE SEEN
Yeast Wet Prep HPF POC: NONE SEEN

## 2018-10-04 LAB — POCT PREGNANCY, URINE: Preg Test, Ur: POSITIVE — AB

## 2018-10-04 LAB — HCG, QUANTITATIVE, PREGNANCY: hCG, Beta Chain, Quant, S: 9685 m[IU]/mL — ABNORMAL HIGH (ref <5)

## 2018-10-04 NOTE — Discharge Instructions (Signed)
Aborto espontáneo °Miscarriage °El aborto espontáneo es la pérdida de un bebé que no ha nacido (feto) antes de la semana 20 del embarazo. La mayor parte de los abortos espontáneos ocurre durante los primeros 3 meses de embarazo. A veces, un aborto ocurre antes de que la mujer sepa que está embarazada. °El aborto espontáneo puede ser una experiencia que afecte emocionalmente a la persona. Si ha sufrido un aborto espontáneo, hable con su médico y hágale las preguntas que tenga sobre el aborto espontáneo, el proceso de duelo y los planes futuros de embarazo. °¿Cuáles son las causas? °Entre las causas de un aborto espontáneo se incluyen las siguientes: °· Problemas genéticos o cromosómicos del feto. Estos problemas impiden que el bebé se desarrolle con normalidad. En general, son el resultado de errores fortuitos que ocurren en la etapa temprana del desarrollo y que no se transmiten de padres a hijos (no se heredan). °· Infección en el cuello del útero. °· Trastornos que afectan el equilibrio hormonal del organismo. °· Problemas en el cuello del útero, como su adelgazamiento y apertura antes de que el embarazo llegue a término (insuficiencia del cuello de útero). °· Problemas en el útero. Estos pueden incluir, entre otros, los siguientes: °? Forma anormal del útero. °? Fibromas en el útero. °? Anormalidades congénitas. Estos son problemas que ya estaban presentes en el nacimiento. °· Ciertas enfermedades crónicas. °· Fumar, beber alcohol o usar drogas. °· Lesiones (traumatismos). °En muchos de los casos, se desconoce la causa de los abortos espontáneos. °¿Cuáles son los signos o los síntomas? °Los síntomas de esta afección incluyen los siguientes: °· Sangrado o manchado vaginal, con o sin cólicos o dolor. °· Dolor o cólicos en el abdomen o en la parte inferior de la espalda. °· Eliminación de líquido, tejidos o coágulos sanguíneos por la vagina. °¿Cómo se diagnostica? °Esta afección se puede diagnosticar en función de  lo siguiente: °· Examen físico. °· Ecografía. °· Análisis de sangre. °· Análisis de orina. °¿Cómo se trata? °En algunos casos, el tratamiento de un aborto espontáneo no es necesario si se eliminan de forma natural todos los tejidos que se encontraban en el útero. Si fuera necesario realizar un tratamiento por esta afección, este puede incluir lo siguiente: °· Dilatación y curetaje (D&C). Mediante este procedimiento, se expande el cuello del útero y se raspan las paredes (endometrio). Esto se realiza solamente si queda tejido del feto o la placenta dentro del cuerpo (aborto espontáneo incompleto). °· Medicamentos, por ejemplo: °? Antibióticos para tratar una infección. °? Medicamentos para ayudar al cuerpo a eliminar los restos de tejido. °? Medicamentos para reducir (contraer) el tamaño del útero. Estos medicamentos se pueden administrar si tiene un sangrado abundante. °Si su factor sanguíneo es Rh negativo y el de su bebé es Rh positivo, usted necesitará una inyección del medicamento llamado inmunoglobulina Rhpara proteger a los bebés futuros de tener problemas con el factor sanguíneo Rh. Los términos "Rh negativo" y "Rh positivo" hacen referencia a la presencia o no en la sangre de una proteína específica que se encuentra en la superficie de los glóbulos rojos (factor Rh). °Siga estas indicaciones en su casa: °Medicamentos ° °· Tome los medicamentos de venta libre y los recetados solamente como se lo haya indicado el médico. °· Si le recetaron antibióticos, tómelos como se lo haya indicado el médico. No deje de tomar los antibióticos aunque comience a sentirse mejor. °· No tome antiinflamatorios no esteroideos (AINE), tales como aspirina e ibuprofeno, a menos que se lo indique el médico. Estos medicamentos pueden provocarle sangrado. °Actividad °· Haga   reposo según lo indicado. Pregúntele al médico qué actividades son seguras para usted. °· Pídale a alguien que la ayude con las responsabilidades familiares y del  hogar durante este tiempo. °Instrucciones generales °· Lleve un registro de la cantidad y la saturación de las toallas higiénicas que utiliza cada día. Anote esta información. °· Anote la cantidad de tejido o coágulos sanguíneos que expulsa por la vagina. Guarde las cantidades grandes de tejidos para que el médico los examine. °· No use tampones, no se haga duchas vaginales ni tenga relaciones sexuales hasta que el médico la autorice. °· Para que usted y su pareja puedan sobrellevar el proceso del duelo, hable con su médico o busque apoyo psicológico. °· Cuando esté lista, visite a su médico para hablar sobre los pasos importantes que deberá seguir en relación con su salud. También hable sobre las medidas que deberá tomar para tener un embarazo saludable en el futuro. °· Concurra a todas las visitas de seguimiento como se lo haya indicado el médico. Esto es importante. °Dónde encontrar más información °· [employer] [place]ine[employer] (American College of Obstetricians and Gynecologists): www.acog.org °· Departamento de Salud y Servicios Humanos de los [place], Oficina de Salud de la Mujer (U.S. Department of Health and Human Services, Office on Women’s Health): www.womenshealth.gov °Comuníquese con un médico si: °· Tiene fiebre o siente escalofríos. °· Tiene una secreción vaginal con mal olor. °· El sangrado aumenta en vez de disminuir. °Solicite ayuda de inmediato si: °· Siente calambres intensos o dolor en la espalda o en el abdomen. °· Elimina coágulos de sangre o tejido por la vagina del tamaño de una nuez o más grandes. °· Necesita más de una toalla higiénica de tamaño regular por hora. °· Se siente mareada o débil. °· Se desmaya. °· Siente una tristeza que la invade o piensa en lastimarse. °Resumen °· La mayor parte de los abortos espontáneos ocurre durante los primeros 3 meses de embarazo. En algunos casos, el aborto espontáneo ocurre antes de que la mujer sepa que está  embarazada. °· Siga las indicaciones del médico para el cuidado en el hogar. Concurra a todas las visitas de control. °· Para que usted y su pareja puedan sobrellevar el proceso del duelo, hable con su médico o busque apoyo psicológico. °Esta información no tiene como fin reemplazar el consejo del médico. Asegúrese de hacerle al médico cualquier pregunta que tenga. °Document Released: 11/22/2004 Document Revised: 11/19/2016 Document Reviewed: 11/19/2016 °Elsevier Patient Education © 2020 [employer] ° °

## 2018-10-04 NOTE — [facility] Note (Signed)
Suzanne Estes is a 38 y.o. at [redacted]w[redacted]d here in [facility] reporting: on Wednesday started having pain that has gotten worse. Started bleeding Friday morning and it has been getting heavier. This AM passed several large clots and possibly some tissue. Changing a pad every 1-2 hours. Still having back and abdominal pain, took tylenol last night with no relief.   LMP: 07/23/18  Onset of complaint: since Wednesday   Pain score: abdomen 2/10, back 2/10  Vitals:   10/04/18 1459  BP: 124/87  Pulse: 95  Resp: 16  Temp: 98.4 F (36.9 C)  SpO2: 99%      Lab orders placed from triage: UA, UPT

## 2018-10-04 NOTE — MAU Provider Note (Signed)
Chief Complaint: Abdominal Pain, Back Pain, and Vaginal Bleeding   First Provider Initiated Contact with Patient 10/04/18 1518     *Spanish interpreter used for this visit*  SUBJECTIVE HPI: Suzanne Estes is a 38 y.o. G4P3003 at 3653w3d who presents to Maternity Admissions reporting vaginal bleeding & abdominal pain. Symptoms started 2 days ago with brown spotting. Bleeding increased yesterday. This morning she passed several large clots & what looked like a gestational sac. Pain has improved since then but bleeding has continued. Not saturating pads but still passes small clots when she goes to the bathroom. Had [facility]/17 but has not had any ob evaluation otherwise.   Location: abdomen Quality: cramping Severity: 2/10 on pain scale Duration: 2 days Timing: intermittent Modifying factors: improved since passing tissue this morning Associated signs and symptoms: vaginal bleeding  Past Medical History:  Diagnosis Date  . Appendicitis, acute 05/17/14   OB History  Gravida Para Term Preterm AB Living  4 3 3     3   SAB TAB Ectopic Multiple Live Births          3    # Outcome Date GA Lbr Len/2nd Weight Sex Delivery Anes PTL Lv  4 Current           3 Term 02/02/11 4431w1d 08:52 / 00:57 2625 g F Vag-Spont EPI  LIV  2 Term 2007 8545w0d  2268 g M Vag-Spont   LIV  1 Term 2004 7445w0d  2722 g M    LIV   Past Surgical History:  Procedure Laterality Date  . LAPAROSCOPIC APPENDECTOMY  05/18/14   Dr. Derrell Lollingamirez  . LAPAROSCOPIC APPENDECTOMY N/A 05/18/2014   Procedure: APPENDECTOMY LAPAROSCOPIC;  Surgeon: Axel FillerArmando Ramirez, MD;  Location: [facility] OR;  Service: General;  Laterality: N/A;   Social History   Socioeconomic History  . Marital status: Single    Spouse name: Not on file  . Number of children: Not on file  . Years of education: Not on file  . Highest education level: Not on file  Occupational History  . Not on file  Social Needs  . Financial resource  strain: Not on file  . Food insecurity    Worry: Not on file    Inability: Not on file  . Transportation needs    Medical: Not on file    Non-medical: Not on file  Tobacco Use  . Smoking status: Never Smoker  . Smokeless tobacco: Never Used  Substance and Sexual Activity  . Alcohol use: No  . Drug use: No  . Sexual activity: Yes    Birth control/protection: None  Lifestyle  . Physical activity    Days per week: Not on file    Minutes per session: Not on file  . Stress: Not on file  Relationships  . Social Musicianconnections    Talks on phone: Not on file    Gets together: Not on file    Attends religious service: Not on file    Active member of club or organization: Not on file    Attends meetings of clubs or organizations: Not on file    Relationship status: Not on file  . Intimate partner violence    Fear of current or ex partner: Not on file    Emotionally abused: Not on file    Physically abused: Not on file    Forced sexual activity: Not on file  Other Topics Concern  . Not on file  Social History Narrative  .  Not on file   Family History  Problem Relation Age of Onset  . Cancer Father   . Anesthesia problems Neg Hx    No current facility-administered medications on file prior to encounter.    Current Outpatient Medications on File Prior to Encounter  Medication Sig Dispense Refill  . hydrOXYzine (ATARAX/VISTARIL) 25 MG tablet Take 1 tablet (25 mg total) by mouth every 6 (six) hours as needed for itching. 12 tablet 0   No Known Allergies  I have reviewed patient's Past Medical Hx, Surgical Hx, Family Hx, Social Hx, medications and allergies.   Review of Systems  Constitutional: Negative.   Gastrointestinal: Positive for abdominal pain.  Genitourinary: Positive for vaginal bleeding.    OBJECTIVE Patient Vitals for the past 24 hrs:  BP Temp Temp src Pulse Resp SpO2 Height Weight  10/04/18 1856 110/74 - - 78 16 99 % - -  10/04/18 1459 124/87 98.4 F (36.9 C)  Oral 95 16 99 % - -  10/04/18 1453 - - - - - - 5\' 2"  (1.575 m) 62 kg   Constitutional: Well-developed, well-nourished female in no acute distress.  Cardiovascular: normal rate & rhythm, no murmur Respiratory: normal rate and effort. Lung sounds clear throughout GI: Abd soft, non-tender, Pos BS x 4. No guarding or rebound tenderness MS: Extremities nontender, no edema, normal ROM Neurologic: Alert and oriented x 4.  GU:     SPECULUM EXAM: NEFG, small amount of dark red blood     LAB RESULTS Results for orders placed or performed during the hospital encounter of 10/04/18 (from the past 24 hour(s))  Urinalysis, Routine w reflex microscopic     Status: Abnormal   Collection Time: 10/04/18 11:49 AM  Result Value Ref Range   Color, Urine AMBER (A) YELLOW   APPearance CLEAR CLEAR   Specific Gravity, Urine 1.003 (L) 1.005 - 1.030   pH 6.0 5.0 - 8.0   Glucose, UA 50 (A) NEGATIVE mg/dL   Hgb urine dipstick LARGE (A) NEGATIVE   Bilirubin Urine NEGATIVE NEGATIVE   Ketones, ur NEGATIVE NEGATIVE mg/dL   Protein, ur 30 (A) NEGATIVE mg/dL   Nitrite NEGATIVE NEGATIVE   Leukocytes,Ua NEGATIVE NEGATIVE   RBC / HPF 21-50 0 - 5 RBC/hpf   WBC, UA 0-5 0 - 5 WBC/hpf   Bacteria, UA RARE (A) NONE SEEN   Squamous Epithelial / LPF 0-5 0 - 5  Pregnancy, urine POC     Status: Abnormal   Collection Time: 10/04/18  2:51 PM  Result Value Ref Range   Preg Test, Ur POSITIVE (A) NEGATIVE  Wet prep, genital     Status: Abnormal   Collection Time: 10/04/18  3:33 PM  Result Value Ref Range   Yeast Wet Prep HPF POC NONE SEEN NONE SEEN   Trich, Wet Prep NONE SEEN NONE SEEN   Clue Cells Wet Prep HPF POC NONE SEEN NONE SEEN   WBC, Wet Prep HPF POC FEW (A) NONE SEEN   Sperm NONE SEEN   CBC     Status: None   Collection Time: 10/04/18  3:37 PM  Result Value Ref Range   WBC 7.7 4.0 - 10.5 K/uL   RBC 4.45 3.87 - 5.11 MIL/uL   Hemoglobin 13.9 12.0 - 15.0 g/dL   HCT 39.9 36.0 - 46.0 %   MCV 89.7 80.0 - 100.0 fL    MCH 31.2 26.0 - 34.0 pg   MCHC 34.8 30.0 - 36.0 g/dL   RDW 11.9 11.5 -  15.5 %   Platelets 242 150 - 400 K/uL   nRBC 0.0 0.0 - 0.2 %  hCG, quantitative, pregnancy     Status: Abnormal   Collection Time: 10/04/18  3:37 PM  Result Value Ref Range   hCG, Beta Chain, Quant, S 9,685 (H) <5 mIU/mL    IMAGING Kore[place] Ob Less Than 14 Weeks With Ob Transvaginal  Result Date: 10/04/2018 CLINICAL DATA:  First trimester pregnancy with pelvic pain and vaginal bleeding. LMP 07/23/2018. EXAM: OBSTETRIC <14 WK US AND TRANSVAGINAL OB US TECHNIQUE: Both transabdominal and transvaginal ultrasound examinations were performed for complete evaluation of the gestation as well as the maternal uterus, adnexal regions, and pelvic cul-de-sac. Transvaginal technique was performed to assess early pregnancy. COMPARISON:  Pelvic CT 05/22/2014.  Obstetric ultrasound 06/14/2010. FINDINGS: Intrauterine gestational sac: There is a small complex fluid collection within the endometrial canal which could reflect a gestational sac. This measures 6.2 x 8.6 cm. Yolk sac:  None visualized. Embryo:  None visualized. Cardiac Activity: None visualized. MSD: 7.4 mm   5 w   3 d Subchorionic hemorrhage:  None visualized. Maternal uterus/adnexae: Both maternal ovaries are visualized and appear normal. There is heterogeneous thickening of the endometrium with small cystic spaces. No free pelvic fluid or suspicious adnexal mass. IMPRESSION: 1. Possible early intrauterine gestational sac, but no yolk sac, fetal pole, or cardiac activity yet visualized. Differential considerations include abnormal intrauterine gestation and ectopic pregnancy. Recommend follow-up quantitative B-HCG levels and follow-up US in 14 days to assess viability. This recommendation follows SRU consensus guidelines: Diagnostic Criteria for Nonviable Pregnancy Early in the First Trimester. Malva Limes Engl J Med 2013; 161:0960-45; 369:1443-51. 2. No adnexal mass or significant free pelvic fluid. Electronically  Signed   By: Carey BullocksWilliam  Veazey M.D.   On: 10/04/2018 17:11    MAU COURSE Orders Placed This Encounter  Procedures  . Wet prep, genital  . US OB LESS THAN 14 WEEKS WITH OB TRANSVAGINAL  . Urinalysis, Routine w reflex microscopic  . CBC  . hCG, quantitative, pregnancy  . Pregnancy, urine POC  . Discharge patient   No orders of the defined types were placed in this encounter.   MDM Patient reports [facility] which would make her at least [redacted]wks pregnant & patient reports sure LMP of 07/23/18. Presents today with tissue she passed at home. Tissue appears to be ~[redacted] wk gestation. Tissue sent to pathology.  Hemoglobin stable. RH positive.   ASSESSMENT 1. Miscarriage   2. Vaginal bleeding in pregnancy, first trimester   3. Abdominal pain during pregnancy in first trimester     PLAN Discharge home in stable condition. Bleeding/infection precautions Message sent to [facility] for HCG next week & SAB follow up  Follow-up Information    [facility] up.   Specialty: Obstetrics and Gynecology Why: Office will call you for follow up appointments Contact information: [address], [address] [id number] [place] [phone]         Allergies as of 10/04/2018   No Known Allergies     Medication List    STOP taking these medications   famotidine 20 MG tablet Commonly known as: PEPCID   predniSONE 10 MG tablet Commonly known as: DELTASONE     TAKE these medications   hydrOXYzine 25 MG tablet Commonly known as: ATARAX/VISTARIL Take 1 tablet (25 mg total) by mouth every 6 (six) hours as needed for itching.  Judeth HornLawrence, Sharmayne Jablon, NP 10/04/2018  8:09 PM

## 2018-10-07 LAB — GC/CHLAMYDIA PROBE AMP (~~LOC~~) NOT AT ARMC
Chlamydia: NEGATIVE
Neisseria Gonorrhea: NEGATIVE

## 2018-10-14 ENCOUNTER — Telehealth: Payer: Self-pay | Admitting: Family Medicine

## 2018-10-14 ENCOUNTER — Other Ambulatory Visit: Payer: Self-pay

## 2018-10-14 NOTE — Telephone Encounter (Signed)
Called patient w/ Suzanne Estes Spanish interpreter about her appointment on 8/19 @ 1:50. Patient instructed to wear a face mask and no visitors are allowed. Patient screened for covid symptoms and denied having any.

## 2018-10-15 ENCOUNTER — Other Ambulatory Visit: Payer: Self-pay

## 2018-10-15 ENCOUNTER — Other Ambulatory Visit: Payer: Self-pay | Admitting: General Practice

## 2018-10-15 DIAGNOSIS — O039 Complete or unspecified spontaneous abortion without complication: Secondary | ICD-10-CM

## 2018-10-16 LAB — BETA HCG QUANT (REF LAB): hCG Quant: 60 m[IU]/mL

## 2018-10-21 ENCOUNTER — Telehealth: Payer: Self-pay | Admitting: Medical

## 2018-10-21 NOTE — Telephone Encounter (Signed)
Attempted to call pt with assistance of [employer], Administrator, sports. Pt did not answer. LM that pt can discuss at her appt tomorrow with the provider.

## 2018-10-21 NOTE — Telephone Encounter (Signed)
Patient spoke with Eda about her appointment tomorrow. Eda instructed her on how to download Webex. Dajanique stated she had not gotten her results back from the blood work she had, and would like a call from the nurse explaining them before her call tomorrow.

## 2018-10-22 ENCOUNTER — Encounter: Payer: Self-pay | Admitting: Medical

## 2018-10-22 ENCOUNTER — Ambulatory Visit (INDEPENDENT_AMBULATORY_CARE_PROVIDER_SITE_OTHER): Payer: Self-pay | Admitting: Medical

## 2018-10-22 ENCOUNTER — Other Ambulatory Visit: Payer: Self-pay

## 2018-10-22 DIAGNOSIS — O039 Complete or unspecified spontaneous abortion without complication: Secondary | ICD-10-CM

## 2018-10-22 NOTE — Patient Instructions (Signed)
Aborto espontáneo °Miscarriage °El aborto espontáneo es la pérdida de un bebé que no ha nacido (feto) antes de la semana 20 del embarazo. °Siga estas indicaciones en su casa: °Medicamentos ° °· Tome los medicamentos de venta libre y los recetados solamente como se lo haya indicado el médico. °· Si le recetaron un antibiótico, tómelo como se lo haya indicado el médico. No deje de tomar los antibióticos aunque comience a sentirse mejor. °· No tome antiinflamatorios no esteroideos (AINE), a menos que el médico le diga que son seguros para usted. Estos incluyen aspirina e ibuprofeno. Estos medicamentos pueden provocarle sangrado. °Actividad °· Haga reposo según lo indicado. Pregúntele al médico qué actividades son seguras para usted. °· Pida ayuda para realizar las tareas de la casa durante este tiempo. °Instrucciones generales °· Anote cuántos apósitos usa por día y cuán saturados están. °· Observe la cantidad de tejido o grumos de sangre (coágulos de sangre) que expulsa por la vagina. Guarde las cantidades grandes de tejido para llevárselas al médico. °· No use tampones, no se haga duchas vaginales ni tenga relaciones sexuales hasta que el médico la autorice. °· Para que usted y su pareja puedan sobrellevar el proceso de duelo, hable con su médico o busque apoyo psicológico. °· Cuando esté lista, acuda al médico para hablar sobre los pasos que debe seguir para cuidar su salud. Además, hable con su médico sobre las medidas que debe adoptar para tener un embarazo saludable en el futuro. °· Concurra a todas las visitas de seguimiento como se lo haya indicado el médico. Esto es importante. °Comuníquese con un médico si: °· Tiene fiebre o siente escalofríos. °· Tiene una secreción vaginal con mal olor. °· Aumenta el sangrado. °Solicite ayuda de inmediato si: °· Tiene espasmos o dolor muy intensos en el abdomen o en la espalda. °· Elimina grumos de sangre por la vagina, que tienen el tamaño de una nuez o más. °· Elimina  tejido por la vagina, que tiene el tamaño de una nuez o más. °· Empapa más de un apósito de tamaño normal por hora. °· Se siente débil o mareada. °· Pierde el conocimiento (se desmaya). °· Siente tristeza que no se va o piensa en lastimarse. °Resumen °· El aborto espontáneo es la pérdida de un bebé que no ha nacido antes de la semana 20 del embarazo. °· Siga las indicaciones de su médico para el cuidado en su hogar. Concurra a todas las visitas de control. °· Para que usted y su pareja puedan sobrellevar el proceso de duelo, hable con su médico o busque apoyo psicológico. °Esta información no tiene como fin reemplazar el consejo del médico. Asegúrese de hacerle al médico cualquier pregunta que tenga. °Document Released: 08/14/2011 Document Revised: 11/19/2016 Document Reviewed: 11/19/2016 °Elsevier Patient Education © 2020 Elsevier Inc. ° °

## 2018-10-22 NOTE — Progress Notes (Signed)
  I connected with  Suzanne Estes on 10/22/18 at  2:15 PM EDT by telephone and verified that I am speaking with the correct person using two identifiers.   I discussed the limitations, risks, security and privacy concerns of performing an evaluation and management service by telephone and the availability of in person appointments. I also discussed with the patient that there may be a patient responsible charge related to this service. The patient expressed understanding and agreed to proceed.  Linda,RN 10/22/2018  3:26 PM

## 2018-10-22 NOTE — Progress Notes (Signed)
TELEHEALTH VIRTUAL GYNECOLOGY VISIT ENCOUNTER NOTE  I connected with Suzanne Estes on 10/22/18 at  2:15 PM EDT by telephone (WebEx failed) at home and verified that I am speaking with the correct person using two identifiers.   I discussed the limitations, risks, security and privacy concerns of performing an evaluation and management service by telephone and the availability of in person appointments. I also discussed with the patient that there may be a patient responsible charge related to this service. The patient expressed understanding and agreed to proceed.   History:  Suzanne Estes is a 38 y.o. [phone] female being evaluated today for follow-up after SAB.Marland Kitchen.  She states only some bleeding following MAU visit on 10/04/18. She denies vaginal bleeding, abdominal pain or fever today. She is not currently sexually active. She declines birth control at this time. She will use condoms. She may desire another pregnancy in the future. Her quant hCG was 60 on 10/15/18.     Past Medical History:  Diagnosis Date  . Appendicitis, acute 05/17/14   Past Surgical History:  Procedure Laterality Date  . LAPAROSCOPIC APPENDECTOMY  05/18/14   Dr. Derrell Lollingamirez  . LAPAROSCOPIC APPENDECTOMY N/A 05/18/2014   Procedure: APPENDECTOMY LAPAROSCOPIC;  Surgeon: Axel FillerArmando Ramirez, MD;  Location: [facility] OR;  Service: General;  Laterality: N/A;   The following portions of the patient's history were reviewed and updated as appropriate: allergies, current medications, past family history, past medical history, past social history, past surgical history and problem list.   Review of Systems:  Pertinent items noted in HPI and remainder of comprehensive ROS otherwise negative.  Physical Exam:   General:  Alert, oriented and cooperative.   Mental Status: Normal mood and affect perceived. Normal judgment and thought content.  Physical exam deferred due to nature of the encounter  Labs and Imaging Results  for orders placed or performed in visit on 10/15/18 (from the past 336 hour(s))  Beta hCG quant (ref lab)   Collection Time: 10/15/18  2:24 PM  Result Value Ref Range   hCG Quant 60 mIU/mL   [place] Ob Less Than 14 Weeks With Ob Transvaginal  Result Date: 10/04/2018 CLINICAL DATA:  First trimester pregnancy with pelvic pain and vaginal bleeding. LMP 07/23/2018. EXAM: OBSTETRIC <14 WK US AND TRANSVAGINAL OB US TECHNIQUE: Both transabdominal and transvaginal ultrasound examinations were performed for complete evaluation of the gestation as well as the maternal uterus, adnexal regions, and pelvic cul-de-sac. Transvaginal technique was performed to assess early pregnancy. COMPARISON:  Pelvic CT 05/22/2014.  Obstetric ultrasound 06/14/2010. FINDINGS: Intrauterine gestational sac: There is a small complex fluid collection within the endometrial canal which could reflect a gestational sac. This measures 6.2 x 8.6 cm. Yolk sac:  None visualized. Embryo:  None visualized. Cardiac Activity: None visualized. MSD: 7.4 mm   5 w   3 d Subchorionic hemorrhage:  None visualized. Maternal uterus/adnexae: Both maternal ovaries are visualized and appear normal. There is heterogeneous thickening of the endometrium with small cystic spaces. No free pelvic fluid or suspicious adnexal mass. IMPRESSION: 1. Possible early intrauterine gestational sac, but no yolk sac, fetal pole, or cardiac activity yet visualized. Differential considerations include abnormal intrauterine gestation and ectopic pregnancy. Recommend follow-up quantitative B-HCG levels and follow-up US in 14 days to assess viability. This recommendation follows SRU consensus guidelines: Diagnostic Criteria for Nonviable Pregnancy Early in the First Trimester. Malva Limes Engl J Med 2013; 454:0981-19; 369:1443-51. 2. No adnexal mass or significant free pelvic fluid. Electronically Signed   By:  Richardean Sale M.D.   On: 10/04/2018 17:11      Assessment and Plan:     SAB - Patient will  follow-up for non-stat hCG level in 1 week - Patient advised we will follow until < 5  - Warning signs for worsening condition discussed - Patient encouraged to use condoms or abstain from intercourse until hCG is < 5      I discussed the assessment and treatment plan with the patient. The patient was provided an opportunity to ask questions and all were answered. The patient agreed with the plan and demonstrated an understanding of the instructions.   The patient was advised to call back or seek an in-person evaluation/go to the ED if the symptoms worsen or if the condition fails to improve as anticipated.  I provided 10 minutes of non-face-to-face time during this encounter.   Kerry Hough, PA-C Center for [facility], [place]

## 2018-10-28 ENCOUNTER — Other Ambulatory Visit: Payer: Self-pay | Admitting: *Deleted

## 2018-10-28 DIAGNOSIS — O039 Complete or unspecified spontaneous abortion without complication: Secondary | ICD-10-CM

## 2018-10-29 ENCOUNTER — Other Ambulatory Visit: Payer: Self-pay

## 2018-10-29 ENCOUNTER — Other Ambulatory Visit: Payer: Self-pay | Admitting: Medical

## 2018-10-29 DIAGNOSIS — O039 Complete or unspecified spontaneous abortion without complication: Secondary | ICD-10-CM

## 2018-10-30 LAB — BETA HCG QUANT (REF LAB): hCG Quant: 5 m[IU]/mL

## 2018-10-31 ENCOUNTER — Telehealth: Payer: Self-pay | Admitting: *Deleted

## 2018-10-31 NOTE — Telephone Encounter (Signed)
-----   Message from Luvenia Redden, PA-C sent at 10/31/2018  9:07 AM EDT ----- Please inform patient that hCG is now 5 which is normal for non-pregnant. No further testing is needed.   Luvenia Redden, PA-C 10/31/2018 9:07 AM

## 2018-10-31 NOTE — Telephone Encounter (Signed)
I called Semone with [facility] [phone] and left a message I am calling with information from your provider-please call our office back. Linda,RN

## 2018-11-04 NOTE — Telephone Encounter (Signed)
Informed pt with Spanish Interpreter [name], that her pregnancy hormone level shows that she is no longer pregnant and no further testing is needed.  Pt verbalized understanding.

## 2019-11-05 ENCOUNTER — Encounter: Payer: Self-pay | Admitting: *Deleted

## 2019-11-05 DIAGNOSIS — Z789 Other specified health status: Secondary | ICD-10-CM

## 2019-11-05 DIAGNOSIS — O0990 Supervision of high risk pregnancy, unspecified, unspecified trimester: Secondary | ICD-10-CM | POA: Insufficient documentation

## 2019-11-05 DIAGNOSIS — O09529 Supervision of elderly multigravida, unspecified trimester: Secondary | ICD-10-CM | POA: Insufficient documentation

## 2019-11-23 ENCOUNTER — Encounter: Payer: Self-pay | Admitting: *Deleted

## 2019-11-23 ENCOUNTER — Other Ambulatory Visit: Payer: Self-pay

## 2019-11-23 ENCOUNTER — Ambulatory Visit (INDEPENDENT_AMBULATORY_CARE_PROVIDER_SITE_OTHER): Payer: Self-pay | Admitting: *Deleted

## 2019-11-23 DIAGNOSIS — O09529 Supervision of elderly multigravida, unspecified trimester: Secondary | ICD-10-CM

## 2019-11-23 DIAGNOSIS — Z789 Other specified health status: Secondary | ICD-10-CM

## 2019-11-23 DIAGNOSIS — O0990 Supervision of high risk pregnancy, unspecified, unspecified trimester: Secondary | ICD-10-CM

## 2019-11-23 NOTE — Progress Notes (Signed)
Addendum: I called to schedule [place] with [facility] but unable to schedule November appointments at this time. Kiele Heavrin,RN

## 2019-11-23 NOTE — Progress Notes (Addendum)
New OB Intake  Patient seen in office today.   I explained I am completing New OB Intake today. We discussed her EDD of [phone] that is based on LMP of [phone]. Pt is G5/P3013. I reviewed her allergies, medications, Medical/Surgical/OB history, and appropriate screenings. I informed her of [facility] services. Based on history, this is a/an complicated by AMA, Language barrier pregnancy. New OB booklets given.   Concerns addressed today  Blood Pressure Cuff Patient adopt a mom . Bp cuff given with instructions.  Explained after first prenatal appt pt will check weekly and document on paper and bring to each appointment.  Anatomy US Explained first scheduled Korea will be around 19 weeks and I will schedule at [facility] and we will give her the appointment at her new ob visit.   Labs Discussed Avelina Laine genetic screening with patient. Would like both Panorama and Horizon drawn-both drawn today.   First visit review I reviewed new OB appt with pt. I explained she will have a pelvic exam,  PAP smear. Explained pt will be seen by  A provider at first visit; encounter routed to appropriate provider.  Daeshaun Specht,RN 11/23/2019  8:52 AM

## 2019-11-24 LAB — CBC/D/PLT+RPR+RH+ABO+RUB AB...
Antibody Screen: NEGATIVE
Basophils Absolute: 0 10*3/uL (ref 0.0–0.2)
Basos: 0 %
EOS (ABSOLUTE): 0.1 10*3/uL (ref 0.0–0.4)
Eos: 1 %
HCV Ab: <0.1 s/co ratio (ref 0.0–0.9)
HIV Screen 4th Generation wRfx: NONREACTIVE
Hematocrit: 36.7 % (ref 34.0–46.6)
Hemoglobin: 12.4 g/dL (ref 11.1–15.9)
Hepatitis B Surface Ag: NEGATIVE
Immature Grans (Abs): 0.1 10*3/uL (ref 0.0–0.1)
Immature Granulocytes: 1 %
Lymphocytes Absolute: 1.4 10*3/uL (ref 0.7–3.1)
Lymphs: 16 %
MCH: 31.6 pg (ref 26.6–33.0)
MCHC: 33.8 g/dL (ref 31.5–35.7)
MCV: 93 fL (ref 79–97)
Monocytes Absolute: 0.4 10*3/uL (ref 0.1–0.9)
Monocytes: 5 %
Neutrophils Absolute: 6.9 10*3/uL (ref 1.4–7.0)
Neutrophils: 77 %
Platelets: 227 10*3/uL (ref 150–450)
RBC: 3.93 x10E6/uL (ref 3.77–5.28)
RDW: 12.6 % (ref 11.7–15.4)
RPR Ser Ql: NONREACTIVE
Rh Factor: POSITIVE
Rubella Antibodies, IGG: 19 index (ref >0.99)
WBC: 8.9 10*3/uL (ref 3.4–10.8)

## 2019-11-24 LAB — HCV INTERPRETATION

## 2019-11-24 LAB — HEMOGLOBIN A1C
Est. average glucose Bld gHb Est-mCnc: 111 mg/dL
Hgb A1c MFr Bld: 5.5 % (ref 4.8–5.6)

## 2019-11-25 ENCOUNTER — Encounter: Payer: Self-pay | Admitting: *Deleted

## 2019-11-25 ENCOUNTER — Encounter: Payer: Self-pay | Admitting: Obstetrics & Gynecology

## 2019-11-25 ENCOUNTER — Other Ambulatory Visit: Payer: Self-pay

## 2019-11-25 ENCOUNTER — Ambulatory Visit (INDEPENDENT_AMBULATORY_CARE_PROVIDER_SITE_OTHER): Payer: Self-pay | Admitting: Obstetrics & Gynecology

## 2019-11-25 ENCOUNTER — Other Ambulatory Visit (HOSPITAL_COMMUNITY)
Admission: RE | Admit: 2019-11-25 | Discharge: 2019-11-25 | Disposition: A | Discharge status: Home / Self Care | Payer: Self-pay | Source: Ambulatory Visit | Attending: Obstetrics & Gynecology | Admitting: Obstetrics & Gynecology

## 2019-11-25 VITALS — BP 126/74 | HR 97 | Wt 138.9 lb

## 2019-11-25 DIAGNOSIS — O0991 Supervision of high risk pregnancy, unspecified, first trimester: Secondary | ICD-10-CM | POA: Insufficient documentation

## 2019-11-25 LAB — POCT URINALYSIS DIP (DEVICE)
Bilirubin Urine: NEGATIVE
Glucose, UA: NEGATIVE mg/dL
Hgb urine dipstick: NEGATIVE
Ketones, ur: NEGATIVE mg/dL
Nitrite: NEGATIVE
Protein, ur: NEGATIVE mg/dL
Specific Gravity, Urine: 1.025 (ref 1.005–1.030)
Urobilinogen, UA: 0.2 mg/dL (ref 0.0–1.0)
pH: 6 (ref 5.0–8.0)

## 2019-11-25 LAB — URINE CULTURE, OB REFLEX

## 2019-11-25 LAB — CULTURE, OB URINE

## 2019-11-25 MED ORDER — PRENATAL VITAMINS 28-0.8 MG PO TABS: 1.0000 | ORAL_TABLET | Freq: Every day | ORAL | 2 refills | Status: AC

## 2019-11-25 NOTE — Progress Notes (Signed)
  Subjective:    Suzanne Estes is a W5Y0998 [redacted]w[redacted]d being seen today for her first obstetrical visit.  Her obstetrical history is significant for advanced maternal age. Patient does intend to breast feed. Pregnancy history fully reviewed. Denies any complications of deliveries from prior births. Considering permanent sterilization.   Patient reports nausea, no bleeding, no contractions, no cramping and no leaking.  Vitals:   11/25/19 0849  BP: 126/74  Pulse: 97  Weight: 138 lb 14.4 oz (63 kg)    HISTORY: OB History  Gravida Para Term Preterm AB Living  5 3 3   1 3   SAB TAB Ectopic Multiple Live Births  1       3    # Outcome Date GA Lbr Len/2nd Weight Sex Delivery Anes PTL Lv  5 Current           4 SAB 09/2018 [redacted]w[redacted]d         3 Term 02/02/11 [redacted]w[redacted]d 08:52 / 00:57 5 lb 12.6 oz (2.625 kg) F Vag-Spont EPI  LIV     Birth Comments: wnl  2 Term 2007 [redacted]w[redacted]d  5 lb (2.268 kg) M Vag-Spont EPI  LIV  1 Term 2004 [redacted]w[redacted]d  6 lb (2.722 kg) M Vag-Spont EPI  LIV     Birth Comments: wnl   Past Medical History:  Diagnosis Date  . Appendicitis, acute 05/17/14   Past Surgical History:  Procedure Laterality Date  . LAPAROSCOPIC APPENDECTOMY  05/18/14   Dr. 05/20/14  . LAPAROSCOPIC APPENDECTOMY N/A 05/18/2014   Procedure: APPENDECTOMY LAPAROSCOPIC;  Surgeon: 05/20/2014, MD;  Location: [facility] OR;  Service: General;  Laterality: N/A;   Family History  Problem Relation Age of Onset  . Cancer Father   . Prostate cancer Father   . Anesthesia problems Neg Hx      Exam    Pelvic Exam:    Perineum: No Hemorrhoids   Vulva: normal   Vagina:  normal mucosa, normal discharge   Cervix: no cervical motion tenderness   Adnexa: normal adnexa   Bony Pelvis: android   Skin: normal coloration and turgor, no rashes    Neurologic: normal   Extremities: normal strength, tone, and muscle mass   HEENT PERRLA   Cardiovascular: regular rate and rhythm   Respiratory:  appears well, vitals normal, no  respiratory distress, acyanotic, normal RR, ear and throat exam is normal, neck free of mass or lymphadenopathy, chest clear, no wheezing, crepitations, rhonchi, normal symmetric air entry   Abdomen: soft, non-tender; bowel sounds normal; no masses,  no organomegaly   Urinary: urethral meatus normal      Assessment:    Pregnancy: Suzanne Estes Patient Active Problem List   Diagnosis Date Noted  . Supervision of high risk pregnancy, antepartum 11/05/2019  . AMA (advanced maternal age) multigravida 35+ 11/05/2019  . Language barrier 11/05/2019  . Acute appendicitis 05/18/2014     Plan:     Initial labs drawn. Prenatal vitamins. Problem list reviewed and updated. Genetic Screening discussed Integrated Screen: NIPT drawn 9/27. 10/27  Ultrasound discussed; fetal survey: requested.  Follow up in 4 weeks. 85% of 10 min visit spent on counseling and coordination of care.     Marland Kitchen 11/25/2019

## 2019-11-25 NOTE — Progress Notes (Signed)
Here for new ob today. Had intake and labs recently. Christien Frankl,RN

## 2019-11-25 NOTE — Patient Instructions (Signed)
First Trimester of Pregnancy  The first trimester of pregnancy is from week 1 until the end of week 13 (months 1 through 3). During this time, your baby will begin to develop inside you. At 6-8 weeks, the eyes and face are formed, and the heartbeat can be seen on ultrasound. At the end of 12 weeks, all the baby's organs are formed. Prenatal care is all the medical care you receive before the birth of your baby. Make sure you get good prenatal care and follow all of your doctor's instructions. Follow these instructions at home: Medicines  Take over-the-counter and prescription medicines only as told by your doctor. Some medicines are safe and some medicines are not safe during pregnancy.  Take a prenatal vitamin that contains at least 600 micrograms (mcg) of folic acid.  If you have trouble pooping (constipation), take medicine that will make your stool soft (stool softener) if your doctor approves. Eating and drinking   Eat regular, healthy meals.  Your doctor will tell you the amount of weight gain that is right for you.  Avoid raw meat and uncooked cheese.  If you feel sick to your stomach (nauseous) or throw up (vomit): ? Eat 4 or 5 small meals a day instead of 3 large meals. ? Try eating a few soda crackers. ? Drink liquids between meals instead of during meals.  To prevent constipation: ? Eat foods that are high in fiber, like fresh fruits and vegetables, whole grains, and beans. ? Drink enough fluids to keep your pee (urine) clear or pale yellow. Activity  Exercise only as told by your doctor. Stop exercising if you have cramps or pain in your lower belly (abdomen) or low back.  Do not exercise if it is too hot, too humid, or if you are in a place of great height (high altitude).  Try to avoid standing for long periods of time. Move your legs often if you must stand in one place for a long time.  Avoid heavy lifting.  Wear low-heeled shoes. Sit and stand up  straight.  You can have sex unless your doctor tells you not to. Relieving pain and discomfort  Wear a good support bra if your breasts are sore.  Take warm water baths (sitz baths) to soothe pain or discomfort caused by hemorrhoids. Use hemorrhoid cream if your doctor says it is okay.  Rest with your legs raised if you have leg cramps or low back pain.  If you have puffy, bulging veins (varicose veins) in your legs: ? Wear support hose or compression stockings as told by your doctor. ? Raise (elevate) your feet for 15 minutes, 3-4 times a day. ? Limit salt in your food. Prenatal care  Schedule your prenatal visits by the twelfth week of pregnancy.  Write down your questions. Take them to your prenatal visits.  Keep all your prenatal visits as told by your doctor. This is important. Safety  Wear your seat belt at all times when driving.  Make a list of emergency phone numbers. The list should include numbers for family, friends, the hospital, and police and fire departments. General instructions  Ask your doctor for a referral to a local prenatal class. Begin classes no later than at the start of month 6 of your pregnancy.  Ask for help if you need counseling or if you need help with nutrition. Your doctor can give you advice or tell you where to go for help.  Do not use hot tubs, steam   rooms, or saunas.  Do not douche or use tampons or scented sanitary pads.  Do not cross your legs for long periods of time.  Avoid all herbs and alcohol. Avoid drugs that are not approved by your doctor.  Do not use any tobacco products, including cigarettes, chewing tobacco, and electronic cigarettes. If you need help quitting, ask your doctor. You may get counseling or other support to help you quit.  Avoid cat litter boxes and soil used by cats. These carry germs that can cause birth defects in the baby and can cause a loss of your baby (miscarriage) or stillbirth.  Visit your dentist.  At home, brush your teeth with a soft toothbrush. Be gentle when you floss. Contact a doctor if:  You are dizzy.  You have mild cramps or pressure in your lower belly.  You have a nagging pain in your belly area.  You continue to feel sick to your stomach, you throw up, or you have watery poop (diarrhea).  You have a bad smelling fluid coming from your vagina.  You have pain when you pee (urinate).  You have increased puffiness (swelling) in your face, hands, legs, or ankles. Get help right away if:  You have a fever.  You are leaking fluid from your vagina.  You have spotting or bleeding from your vagina.  You have very bad belly cramping or pain.  You gain or lose weight rapidly.  You throw up blood. It may look like coffee grounds.  You are around people who have German measles, fifth disease, or chickenpox.  You have a very bad headache.  You have shortness of breath.  You have any kind of trauma, such as from a fall or a car accident. Summary  The first trimester of pregnancy is from week 1 until the end of week 13 (months 1 through 3).  To take care of yourself and your unborn baby, you will need to eat healthy meals, take medicines only if your doctor tells you to do so, and do activities that are safe for you and your baby.  Keep all follow-up visits as told by your doctor. This is important as your doctor will have to ensure that your baby is healthy and growing well. This information is not intended to replace advice given to you by your health care provider. Make sure you discuss any questions you have with your health care provider. Document Revised: 06/05/2018 Document Reviewed: 02/21/2016 Elsevier Patient Education  2020 [employer]  

## 2019-11-25 NOTE — Progress Notes (Signed)
Here for new ob visit. C/o month ago had bloody / yellow discharge once, none since.

## 2019-11-26 LAB — CERVICOVAGINAL ANCILLARY ONLY
Chlamydia: NEGATIVE
Comment: NEGATIVE
Comment: NORMAL
Neisseria Gonorrhea: NEGATIVE

## 2019-11-27 ENCOUNTER — Telehealth: Payer: Self-pay

## 2019-11-27 NOTE — Telephone Encounter (Signed)
Suzanne Estes called with questions regarding recent genetic screening order. Call transferred from front office to clinical staff. Verified [facility] order should be for H14 plus 13 additional genes.

## 2019-12-16 ENCOUNTER — Encounter: Payer: Self-pay | Admitting: *Deleted

## 2019-12-23 ENCOUNTER — Other Ambulatory Visit (HOSPITAL_COMMUNITY)
Admission: RE | Admit: 2019-12-23 | Discharge: 2019-12-23 | Disposition: A | Discharge status: Home / Self Care | Payer: Self-pay | Source: Ambulatory Visit | Attending: Obstetrics and Gynecology | Admitting: Obstetrics and Gynecology

## 2019-12-23 ENCOUNTER — Other Ambulatory Visit: Payer: Self-pay

## 2019-12-23 ENCOUNTER — Ambulatory Visit (INDEPENDENT_AMBULATORY_CARE_PROVIDER_SITE_OTHER): Payer: Self-pay | Admitting: Obstetrics and Gynecology

## 2019-12-23 ENCOUNTER — Encounter: Payer: Self-pay | Admitting: Obstetrics and Gynecology

## 2019-12-23 VITALS — BP 122/79 | HR 91 | Wt 140.8 lb

## 2019-12-23 DIAGNOSIS — O09529 Supervision of elderly multigravida, unspecified trimester: Secondary | ICD-10-CM

## 2019-12-23 DIAGNOSIS — N898 Other specified noninflammatory disorders of vagina: Secondary | ICD-10-CM

## 2019-12-23 DIAGNOSIS — Z23 Encounter for immunization: Secondary | ICD-10-CM

## 2019-12-23 DIAGNOSIS — O0990 Supervision of high risk pregnancy, unspecified, unspecified trimester: Secondary | ICD-10-CM | POA: Insufficient documentation

## 2019-12-23 DIAGNOSIS — Z789 Other specified health status: Secondary | ICD-10-CM

## 2019-12-23 MED ORDER — ASPIRIN EC 81 MG PO TBEC: 81.0000 mg | DELAYED_RELEASE_TABLET | Freq: Every day | ORAL | 2 refills | Status: DC

## 2019-12-23 NOTE — Progress Notes (Signed)
   PRENATAL VISIT NOTE  Subjective:  Suzanne Estes is a 39 y.o. [phone] at [redacted]w[redacted]d being seen today for ongoing prenatal care.  She is currently monitored for the following issues for this low-risk pregnancy and has Acute appendicitis; Supervision of high risk pregnancy, antepartum; AMA (advanced maternal age) multigravida 35+; and Language barrier on their problem list.  Patient reports no complaints.  Contractions: Not present. Vag. Bleeding: None.  Movement: Absent. Denies leaking of fluid.   The following portions of the patient's history were reviewed and updated as appropriate: allergies, current medications, past family history, past medical history, past social history, past surgical history and problem list.   Objective:   Vitals:   12/23/19 1010  BP: 122/79  Pulse: 91  Weight: 140 lb 12.8 oz (63.9 kg)    Fetal Status:     Movement: Absent     General:  Alert, oriented and cooperative. Patient is in no acute distress.  Skin: Skin is warm and dry. No rash noted.   Cardiovascular: Normal heart rate noted  Respiratory: Normal respiratory effort, no problems with respiration noted  Abdomen: Soft, gravid, appropriate for gestational age.  Pain/Pressure: Present     Pelvic: Cervical exam deferred        Extremities: Normal range of motion.  Edema: None  Mental Status: Normal mood and affect. Normal behavior. Normal judgment and thought content.   Assessment and Plan:  Pregnancy: [id number] at [redacted]w[redacted]d  1. Supervision of high risk pregnancy, antepartum - Flu Vaccine QUAD 36+ mos IM - Cervicovaginal ancillary only( [place]) - gave Urology info for vasectomy  2. Antepartum multigravida of advanced maternal age Given age and h/o 2 SGA babies, recommend starting baby ASA Reviewed risks/benefits, she is agreeable  3. Language barrier Engineer, structural used  4. Vaginal discharge Self-swab today  Preterm labor symptoms and general obstetric precautions including but  not limited to vaginal bleeding, contractions, leaking of fluid and fetal movement were reviewed in detail with the patient. Please refer to After Visit Summary for other counseling recommendations.   Return in about 4 weeks (around 01/20/2020) for in person, high OB.  No future appointments.  Conan Bowens, MD

## 2019-12-23 NOTE — Progress Notes (Signed)
Patient reports cottage cheese like, yellow vaginal discharge with a foul odor. Self swab instructions given and specimen obtained.  Fleet Contras RN 12/23/19

## 2019-12-23 NOTE — Progress Notes (Signed)
Scheduled anatomy ultrasound appt with [facility] for 11/8 @ 9am

## 2019-12-23 NOTE — Patient Instructions (Signed)
Call [facility] to schedule your vasectomy. [address], [place], [place] [address] Phone: [phone]   

## 2019-12-24 LAB — CERVICOVAGINAL ANCILLARY ONLY
Bacterial Vaginitis (gardnerella): NEGATIVE
Candida Glabrata: NEGATIVE
Candida Vaginitis: POSITIVE — AB
Chlamydia: NEGATIVE
Comment: NEGATIVE
Comment: NEGATIVE
Comment: NEGATIVE
Comment: NEGATIVE
Comment: NEGATIVE
Comment: NORMAL
Neisseria Gonorrhea: NEGATIVE
Trichomonas: NEGATIVE

## 2019-12-24 MED ORDER — CLOTRIMAZOLE 1 % VA CREA: 1.0000 | TOPICAL_CREAM | Freq: Every day | VAGINAL | 2 refills | Status: DC

## 2019-12-24 NOTE — Addendum Note (Signed)
Addended by: Leroy Libman on: 12/24/2019 01:55 PM   Modules accepted: Orders

## 2019-12-25 ENCOUNTER — Telehealth: Payer: Self-pay | Admitting: Lactation Services

## 2019-12-25 NOTE — Telephone Encounter (Signed)
Called patient with assistance of [facility] Telephone Spanish Interpreter [place] # [phone].   Patient informed that the vaginal swab shows that she has a yeast infection and medication has been sent to her pharmacy.   Patient picked up prescription yesterday. She denies questions or concerns.

## 2019-12-25 NOTE — Telephone Encounter (Signed)
-----   Message from Conan Bowens, MD sent at 12/24/2019  1:54 PM EDT ----- Positive yeast, Rx sent to pharmacy, please call and let patient know

## 2020-01-13 ENCOUNTER — Encounter: Payer: Self-pay | Admitting: Radiology

## 2020-01-14 ENCOUNTER — Encounter: Payer: Self-pay | Admitting: *Deleted

## 2020-01-20 ENCOUNTER — Other Ambulatory Visit: Payer: Self-pay

## 2020-01-20 ENCOUNTER — Ambulatory Visit (INDEPENDENT_AMBULATORY_CARE_PROVIDER_SITE_OTHER): Payer: Self-pay | Admitting: Obstetrics and Gynecology

## 2020-01-20 VITALS — BP 119/77 | HR 93 | Wt 143.1 lb

## 2020-01-20 DIAGNOSIS — Z3A21 21 weeks gestation of pregnancy: Secondary | ICD-10-CM

## 2020-01-20 DIAGNOSIS — O0990 Supervision of high risk pregnancy, unspecified, unspecified trimester: Secondary | ICD-10-CM

## 2020-01-20 DIAGNOSIS — O3503X0 Maternal care for (suspected) central nervous system malformation or damage in fetus, choroid plexus cysts, not applicable or unspecified: Secondary | ICD-10-CM

## 2020-01-20 DIAGNOSIS — O350XX0 Maternal care for (suspected) central nervous system malformation in fetus, not applicable or unspecified: Secondary | ICD-10-CM | POA: Insufficient documentation

## 2020-01-20 DIAGNOSIS — O09522 Supervision of elderly multigravida, second trimester: Secondary | ICD-10-CM

## 2020-01-20 NOTE — Progress Notes (Addendum)
Spanish Interpreter Nohella T.  [place] scheduled at [facility] for December 30th @ 0900.  Pt notified.   Addison Naegeli, RN

## 2020-01-20 NOTE — Progress Notes (Signed)
Prenatal Visit Note Date: 01/20/2020 Clinic: [facility]  Subjective:  Suzanne Estes is a 39 y.o. [id number] at [redacted]w[redacted]d being seen today for ongoing prenatal care.  She is currently monitored for the following issues for this high-risk pregnancy and has Acute appendicitis; Supervision of high risk pregnancy, antepartum; AMA (advanced maternal age) multigravida 35+; Language barrier; and Choroid plexus cysts, fetal, affecting care of mother, antepartum on their problem list.  Patient reports left leg sciatic pain and feels like yeast infection s/s are coming back.  Contractions: Not present. Vag. Bleeding: None.  Movement: Present. Denies leaking of fluid.   The following portions of the patient's history were reviewed and updated as appropriate: allergies, current medications, past family history, past medical history, past social history, past surgical history and problem list. Problem list updated.  Objective:   Vitals:   01/20/20 1056  BP: 119/77  Pulse: 93  Weight: 143 lb 1.6 oz (64.9 kg)    Fetal Status: Fetal Heart Rate (bpm): 145   Movement: Present     General:  Alert, oriented and cooperative. Patient is in no acute distress.  Skin: Skin is warm and dry. No rash noted.   Cardiovascular: Normal heart rate noted  Respiratory: Normal respiratory effort, no problems with respiration noted  Abdomen: Soft, gravid, appropriate for gestational age. Pain/Pressure: Present     Pelvic:  Cervical exam deferred        Extremities: Normal range of motion.  Edema: None  Mental Status: Normal mood and affect. Normal behavior. Normal judgment and thought content.   Urinalysis:      Assessment and Plan:  Pregnancy: [id number] at [redacted]w[redacted]d  1. Supervision of high risk pregnancy, antepartum Routine care. Recommend pregnancy belt. Pt has refill vaginal cream  2. Multigravida of advanced maternal age in second trimester I told her it's reassuring she had negative  panorama testing earlier in pregnancy, but I recommend an afp today a recommend formal mfm u/s; she declines these today due to cost  Pt amenable to rpt growth in mid December. I recommend qmonth u/s and qwk testing starting at 36wks and IOL at 39-40wks  3. Choroid plexus cyst of fetus affecting care of mother, antepartum, single or unspecified fetus See above  Interpreter used  Preterm labor symptoms and general obstetric precautions including but not limited to vaginal bleeding, contractions, leaking of fluid and fetal movement were reviewed in detail with the patient. Please refer to After Visit Summary for other counseling recommendations.  Return in about 3 weeks (around 02/10/2020) for in person, md or app.   Mansfield Bing, MD

## 2020-01-20 NOTE — Patient Instructions (Signed)
Your baby has bilateral choroid plexus cysts  Let us know if you would like to have the high risk doctors do an ultrasound for baby and not the health department

## 2020-01-25 ENCOUNTER — Encounter: Payer: Self-pay | Admitting: *Deleted

## 2020-02-11 ENCOUNTER — Encounter: Payer: Self-pay | Admitting: Family Medicine

## 2020-02-11 ENCOUNTER — Ambulatory Visit (INDEPENDENT_AMBULATORY_CARE_PROVIDER_SITE_OTHER): Payer: Self-pay | Admitting: Family Medicine

## 2020-02-11 ENCOUNTER — Other Ambulatory Visit: Payer: Self-pay

## 2020-02-11 VITALS — BP 116/68 | HR 97 | Wt 138.6 lb

## 2020-02-11 DIAGNOSIS — Z758 Other problems related to medical facilities and other health care: Secondary | ICD-10-CM

## 2020-02-11 DIAGNOSIS — Z789 Other specified health status: Secondary | ICD-10-CM

## 2020-02-11 DIAGNOSIS — O3503X0 Maternal care for (suspected) central nervous system malformation or damage in fetus, choroid plexus cysts, not applicable or unspecified: Secondary | ICD-10-CM

## 2020-02-11 DIAGNOSIS — O09522 Supervision of elderly multigravida, second trimester: Secondary | ICD-10-CM

## 2020-02-11 DIAGNOSIS — O350XX0 Maternal care for (suspected) central nervous system malformation in fetus, not applicable or unspecified: Secondary | ICD-10-CM

## 2020-02-11 DIAGNOSIS — O0990 Supervision of high risk pregnancy, unspecified, unspecified trimester: Secondary | ICD-10-CM

## 2020-02-11 NOTE — Progress Notes (Signed)
   Subjective:  Suzanne Estes is a 39 y.o. [phone] at [redacted]w[redacted]d being seen today for ongoing prenatal care.  She is currently monitored for the following issues for this high-risk pregnancy and has Acute appendicitis; Supervision of high risk pregnancy, antepartum; AMA (advanced maternal age) multigravida 35+; Language barrier; Choroid plexus cysts, fetal, affecting care of mother, antepartum; and Family history of malignant neoplasm of gastrointestinal tract on their problem list.  Patient reports backache.  Contractions: Not present. Vag. Bleeding: None.  Movement: Present. Denies leaking of fluid.   The following portions of the patient's history were reviewed and updated as appropriate: allergies, current medications, past family history, past medical history, past social history, past surgical history and problem list. Problem list updated.  Objective:   Vitals:   02/11/20 1108  BP: 116/68  Pulse: 97  Weight: 138 lb 9.6 oz (62.9 kg)    Fetal Status: Fetal Heart Rate (bpm): 145   Movement: Present     General:  Alert, oriented and cooperative. Patient is in no acute distress.  Skin: Skin is warm and dry. No rash noted.   Cardiovascular: Normal heart rate noted  Respiratory: Normal respiratory effort, no problems with respiration noted  Abdomen: Soft, gravid, appropriate for gestational age. Pain/Pressure: Present     Pelvic: Vag. Bleeding: None     Cervical exam deferred        Extremities: Normal range of motion.  Edema: None  Mental Status: Normal mood and affect. Normal behavior. Normal judgment and thought content.   Urinalysis:      Assessment and Plan:  Pregnancy: [id number] at [redacted]w[redacted]d  1. Supervision of high risk pregnancy, antepartum BP and FHR normal Discussed contraception at length with her partner present Interested in vasectomy, clarified some misconceptions, they will consider this option more if it is financially viable for them  2. Multigravida of advanced  maternal age in second trimester Discussed recommendations for antenatal testing given AMA She has significant financial restraints, is an [employer] [facility] [employer] patient Has repeat US scheduled at [facility] for the end of the month at ~[redacted] weeks gestation Discussed obtaining at least one more in about 7-8 weeks so that at least we can keep an eye on growth while also staying vigilant with fundal heights  She is in agreement with plan  3. Language barrier Visit conducted in Spanish  Preterm labor symptoms and general obstetric precautions including but not limited to vaginal bleeding, contractions, leaking of fluid and fetal movement were reviewed in detail with the patient. Please refer to After Visit Summary for other counseling recommendations.  Return in 4 weeks (on 03/10/2020).   Venora Maples, MD

## 2020-02-11 NOTE — Patient Instructions (Signed)
° °Segundo trimestre de embarazo °Second Trimester of Pregnancy °El segundo trimestre va desde la semana 14 hasta la 27, desde el cuarto hasta el sexto mes, y suele ser el momento en el que mejor se siente. Su organismo se ha adaptado a estar embarazada, y comienza a sentirse físicamente mejor. En general, las náuseas matutinas han disminuido o han desaparecido completamente, puede tener más energía y un aumento de apetito. El segundo trimestre es también la época en la que el feto se desarrolla rápidamente. Hacia el final del sexto mes, el feto mide aproximadamente 9 pulgadas (23 cm) y pesa alrededor de 1½ libras (700 g). Es probable que sienta que el bebé se mueve (da pataditas) entre las 16 y 20 semanas del embarazo. °Cambios en el cuerpo durante el segundo trimestre °Su cuerpo continua experimentando numerosos cambios durante su segundo trimestre. Estos cambios varían de una mujer a otra. °· Seguirá aumentando de peso. Notará que la parte baja del abdomen sobresale. °· Podrán aparecer las primeras estrías en las caderas, el abdomen y las mamas. °· Es posible que tenga dolores de cabeza que pueden aliviarse con ciertos medicamentos. Los medicamentos que tome deben estar aprobados por el médico. °· Tal vez tenga necesidad de orinar con más frecuencia porque el feto está ejerciendo presión sobre la vejiga. °· Debido al embarazo podrá sentir acidez estomacal con frecuencia. °· Puede estar estreñida, ya que ciertas hormonas enlentecen los movimientos de los músculos que empujan los desechos a través de los intestinos. °· Pueden aparecer hemorroides o abultarse e hincharse las venas (venas varicosas). °· Puede sentir dolor en la espalda. Esto se debe a: °? Aumento de peso. °? Las hormonas del embarazo relajan las articulaciones en la pelvis. °? Un cambio en el peso y los músculos que ayudan a mantener su equilibrio. °· Sus pechos seguirán creciendo y se pondrán cada vez más sensibles. °· Las encías pueden sangrar y  estar sensibles al cepillado y al hilo dental. °· Pueden aparecer zonas oscuras o manchas (cloasma, máscara del embarazo) en el rostro. Esto probablemente se atenuará después del nacimiento del bebé. °· Es posible que se forme una línea oscura desde el ombligo hasta la zona del pubis (linea nigra). Esto probablemente se atenuará después del nacimiento del bebé. °· Tal vez haya cambios en el cabello. Esto cambios pueden incluir su engrosamiento, crecimiento rápido y cambios en la textura. Además, a algunas mujeres se les cae el cabello durante o después del embarazo, o tienen el cabello seco o fino. Lo más probable es que el cabello se le normalice después del nacimiento del bebé. °Qué debe esperar en las visitas prenatales °Durante una visita prenatal de rutina: °· La pesarán para asegurarse de que usted y el feto están creciendo normalmente. °· Le tomarán la presión arterial. °· Le medirán el abdomen para controlar el desarrollo del bebé. °· Se escucharán los latidos cardíacos fetales. °· Se evaluarán los resultados de los estudios solicitados en visitas anteriores. °El médico puede preguntarle lo siguiente: °· Cómo se siente. °· Si siente los movimientos del bebé. °· Si ha tenido síntomas anormales, como pérdida de líquido, sangrado, dolores de cabeza intensos o cólicos abdominales. °· Si está consumiendo algún producto que contenga tabaco, como cigarrillos, tabaco de mascar y cigarrillos electrónicos. °· Si tiene alguna pregunta. °Otros estudios que podrán realizarse durante el segundo trimestre incluyen lo siguiente: °· Análisis de sangre para detectar lo siguiente: °? Concentraciones de hierro bajas (anemia). °? Nivel alto de azúcar en la sangre que afecta a las mujeres embarazadas (  diabetes gestacional) entre las semanas 24 y 28. °? Anticuerpos Rh. Esto es para detectar una proteína en los glóbulos rojos (factor Rh). °· Análisis de orina para detectar infecciones, diabetes o proteínas en la orina. °· Una  ecografía para confirmar que el bebé crece y se desarrolla correctamente. °· Una amniocentesis para diagnosticar posibles problemas genéticos. °· Estudios del feto para descartar espina bífida y síndrome de Down. °· Prueba del VIH (virus de inmunodeficiencia humana). Los exámenes prenatales de rutina incluyen la prueba de detección del VIH, a menos que decida no realizársela. °Siga estas indicaciones en su casa: °Medicamentos °· Siga las indicaciones del médico en relación con el uso de medicamentos. Durante el embarazo, hay medicamentos que pueden tomarse y otros que no. °· Tome vitaminas prenatales que contengan por lo menos 600 microgramos (?g) de ácido fólico. °· Si está estreñida, tome un laxante suave, si el médico lo autoriza. °Qué debe comer y beber ° °· Lleve una dieta equilibrada que incluya gran cantidad de frutas y verduras frescas, cereales integrales, buenas fuentes de proteínas como carnes magras, huevos o tofu, y lácteos descremados. El médico la ayudará a determinar la cantidad de peso que puede aumentar. °· No coma carne cruda ni quesos sin cocinar. Estos elementos contienen gérmenes que pueden causar defectos congénitos en el bebé. °· Si no consume muchos alimentos con calcio, hable con su médico sobre si debería tomar un suplemento diario de calcio. °· Limite el consumo de alimentos con alto contenido de grasas y azúcares procesados, como alimentos fritos o dulces. °· Para evitar el estreñimiento: °? Bebe suficiente líquido para mantener la orina clara o de color amarillo pálido. °? Consuma alimentos ricos en fibra, como frutas y verduras frescas, cereales integrales y frijoles. °Actividad °· Haga ejercicio solamente como se lo haya indicado el médico. La mayoría de las mujeres pueden continuar su rutina de ejercicios durante el embarazo. Intente realizar como mínimo 30 minutos de actividad física por lo menos 5 días a la semana. Deje de hacer ejercicio si experimenta contracciones  uterinas. °· No levante objetos pesados, use zapatos de tacones bajos y mantenga una buena postura. °· Puede seguir manteniendo relaciones sexuales, a menos que el médico le indique lo contrario. °Alivio del dolor y del malestar °· Use un sostén que le brinde buen soporte para prevenir las molestias causadas por la sensibilidad en los pechos. °· Dese baños de asiento con agua tibia para aliviar el dolor o las molestias causadas por las hemorroides. Use una crema para las hemorroides si el médico la autoriza. °· Descanse con las piernas elevadas si tiene calambres o dolor de cintura. °· Si tiene venas varicosas, use medias de descanso. Eleve los pies durante 15 minutos, 3 o 4 veces por día. Limite el consumo de sal en su dieta. °Cuidados prenatales °· Escriba sus preguntas. Llévelas cuando concurra a las visitas prenatales. °· Concurra a todas las visitas prenatales tal como se lo haya indicado el médico. Esto es importante. °Seguridad °· Use el cinturón de seguridad en todo momento mientras conduce. °· Haga una lista de los números de teléfono de emergencia, que incluya los números de teléfono de familiares, amigos, el hospital y los departamentos de policía y bomberos. °Instrucciones generales °· Pídale al médico que la derive a clases de educación prenatal en su localidad. Debe comenzar a tomar las clases antes de que empiece el mes 6 de embarazo. °· Pida ayuda si tiene necesidades nutricionales o de asesoramiento durante el embarazo. El médico puede aconsejarla o derivarla a especialistas para que   la ayuden con diferentes necesidades. °· No se dé baños de inmersión en agua caliente, baños turcos ni saunas. °· No se haga duchas vaginales ni use tampones o toallas higiénicas perfumadas. °· No mantenga las piernas cruzadas durante mucho tiempo. °· Evite el contacto con las bandejas sanitarias de los gatos y la tierra que estos animales usan. Estos elementos contienen bacterias que pueden causar defectos congénitos  al bebé y la posible pérdida del feto debido a un aborto espontáneo o muerte fetal. °· Evite fumar, consumir hierbas, beber alcohol y tomar fármacos que no le hayan recetado. Las sustancias químicas que estos productos contienen pueden afectar la formación y el desarrollo del bebé. °· No consuma ningún producto que contenga nicotina o tabaco, como cigarrillos y cigarrillos electrónicos. Si necesita ayuda para dejar de fumar, consulte al médico. °· Visite a su dentista si aún no lo ha hecho durante el embarazo. Use un cepillo de dientes blando para higienizarse los dientes y pásese el hilo dental con suavidad. °Comuníquese con un médico si: °· Tiene mareos. °· Siente cólicos leves, presión en la pelvis o dolor persistente en el abdomen. °· Tiene náuseas, vómitos o diarrea persistentes. °· Observa una secreción vaginal con mal olor. °· Siente dolor al orinar. °Solicite ayuda de inmediato si: °· Tiene fiebre. °· Tiene una pérdida de líquido por la vagina. °· Tiene sangrado o pequeñas pérdidas vaginales. °· Siente dolor intenso o cólicos en el abdomen. °· Sube de peso o baja de peso rápidamente. °· Tiene dificultad para respirar y siente dolor de pecho. °· Súbitamente se le hinchan mucho el rostro, las manos, los tobillos, los pies o las piernas. °· No ha sentido los movimientos del bebé durante una hora. °· Siente un dolor de cabeza intenso que no se alivia al tomar medicamentos. °· Nota cambios en la visión. °Resumen °· El segundo trimestre va desde la semana 14 hasta la 27, desde el cuarto hasta el sexto mes. Es también una época en la que el feto se desarrolla rápidamente. °· Su organismo atraviesa por muchos cambios durante el embarazo. Estos cambios varían de una mujer a otra. °· Evite fumar, consumir hierbas, beber alcohol y tomar fármacos que no le hayan recetado. Estas sustancias químicas afectan la formación y el desarrollo de su bebé. °· No consuma ningún producto que contenga tabaco, lo que incluye  cigarrillos, tabaco de mascar y cigarrillos electrónicos. Si necesita ayuda para dejar de fumar, consulte al médico. °· Comuníquese con su médico si tiene preguntas sobre esto. Concurra a todas las visitas prenatales tal como se lo haya indicado el médico. Esto es importante. °Esta información no tiene como fin reemplazar el consejo del médico. Asegúrese de hacerle al médico cualquier pregunta que tenga. °Document Revised: 06/25/2016 Document Reviewed: 06/25/2016 °Elsevier Patient Education © 2020 [employer] ° ° °Elección del método anticonceptivo °Contraception Choices °La anticoncepción, o los métodos anticonceptivos, hace referencia a los métodos o dispositivos que evitan el embarazo. °Métodos hormonales °Implante anticonceptivo ° °Un implante anticonceptivo consiste en un tubo plástico delgado que contiene una hormona. Se inserta en la parte superior del brazo. Puede permanecer en el lugar hasta por 3 años. °Inyecciones de progestina sola °Las inyecciones de progestina sola contienen progestina, una forma sintética de la hormona progesterona. Un médico las administra cada 3 meses. °Píldoras anticonceptivas ° °Las píldoras anticonceptivas son pastillas que contienen hormonas que evitan el embarazo. Deben tomarse una vez al día, preferentemente a la misma hora cada día. °Parches anticonceptivos ° °El parche anticonceptivo contiene hormonas   que evitan el embarazo. Se coloca en la piel, debe cambiarse una vez a la semana durante tres semanas y debe retirarse en la cuarta semana. Se necesita una receta para utilizar este método anticonceptivo. °Anillo vaginal ° °Un anillo vaginal contiene hormonas que evitan el embarazo. Se coloca en la vagina durante tres semanas y se retira en la cuarta semana. Luego se repite el proceso con un anillo nuevo. Se necesita una receta para utilizar este método anticonceptivo. °Anticonceptivo de emergencia °Los anticonceptivos de emergencia son métodos para evitar un embarazo después  de tener sexo sin protección. Vienen en forma de píldora y pueden tomarse hasta 5 días después de tener sexo. Funcionan mejor cuando se toman lo más pronto posible luego de tener sexo. La mayoría de los anticonceptivos de emergencia están disponibles sin receta médica. Este método no debe utilizarse como el único método anticonceptivo. °Métodos de barrera °Preservativo masculino ° °Un preservativo masculino es una vaina delgada que se coloca sobre el pene durante el sexo. Los preservativos evitan que el esperma ingrese en el cuerpo de la mujer. Pueden utilizarse con un espermicida para aumentar la efectividad. Deben desecharse luego de su uso. °Preservativo femenino ° °Un preservativo femenino es una vaina blanda y holgada que se coloca en la vagina antes de tener sexo. El preservativo evita que el esperma ingrese en el cuerpo de la mujer. Deben desecharse luego de su uso. °Diafragma ° °Un diafragma es una barrera blanda con forma de cúpula. Se inserta en la vagina antes del sexo, junto con un espermicida. El diafragma bloquea el ingreso de esperma en el útero, y el espermicida mata a los espermatozoides. El diafragma debe permanecer en la vagina durante 6 a 8 horas después de tener sexo y debe retirarse en el plazo de las 24 horas. °Un diafragma es recetado y colocado por un médico. Debe reemplazarse cada 1 a 2 años, después de dar a luz, de aumentar más de 15 lb (6,8 kg) y de una cirugía pélvica. °Capuchón cervical ° °Un capuchón cervical es una copa redonda y blanda de látex o plástico que se coloca en el cuello uterino. Se inserta en la vagina antes del sexo, junto con un espermicida. Bloquea el ingreso del esperma en el útero. El capuchón debe permanecer en el lugar durante 6 a 8 horas después de tener sexo y debe retirarse en el plazo de las 48 horas. Un capuchón cervical debe ser recetado y colocado por un médico. Debe reemplazarse cada 2 años. °Esponja ° °Una esponja es una pieza blanda y circular de espuma  de poliuretano que contiene espermicida. La esponja ayuda a bloquear el ingreso de esperma en el útero, y el espermicida mata a los espermatozoides. Para utilizarla, debe humedecerla e insertarla en la vagina. Debe insertarse antes de tener sexo, debe permanecer dentro al menos durante 6 horas después de tener sexo y debe retirarse y desecharse en el plazo de las 30 horas. °Espermicidas °Los espermicidas son sustancias químicas que matan o bloquean al esperma y no lo dejan ingresar al cuello uterino y al útero. Vienen en forma de crema, gel, supositorio, espuma o comprimido. Un espermicida debe insertarse en la vagina con un aplicador al menos 10 o 15 minutos antes de tener sexo para dar tiempo a que surta efecto. El proceso debe repetirse cada vez que tenga sexo. Los espermicidas no requieren receta médica. °Anticonceptivos intrauterinos °Dispositivo intrauterino (DIU). °Un DIU es un dispositivo en forma de T que se coloca en el útero. Existen dos tipos: °· DIU   hormonal.Este tipo contiene progestina, una forma sintética de la hormona progesterona. Este tipo puede permanecer colocado durante 3 a 5 años. °· DIU de cobre.Este tipo está recubierto con un alambre de cobre. Puede permanecer colocado durante 10 años. ° °Métodos anticonceptivos permanentes °Ligadura de trompas en la mujer °En este método, se sellan, atan u obstruyen las trompas de Falopio durante una cirugía para evitar que el óvulo descienda hacia el útero. °Esterilización histeroscópica °En este método, se coloca un implante pequeño y flexible dentro de cada trompa de Falopio. Los implantes hacen que se forme un tejido cicatricial en las trompas de Falopio y que las obstruya para que el espermatozoide no pueda llegar al óvulo. El procedimiento demora alrededor de 3 meses para que sea efectivo. Debe utilizarse otro método anticonceptivo durante esos 3 meses. °Esterilización masculina °Este es un procedimiento que consiste en atar los conductos que  transportan el esperma (vasectomía). Luego del procedimiento, el hombre puede eyacular líquido (semen). °Métodos de planificación natural °Planificación familiar natural °En este método, la pareja no tiene sexo durante los días en que la mujer podría quedar embarazada. °Método calendario °Esto significa realizar un seguimiento de la duración de cada ciclo menstrual, identificar los días en los que se puede producir un embarazo y no tener sexo durante esos días. °Método de la ovulación °En este método, la pareja evita tener sexo durante la ovulación. °Método sintotérmico °Este método implica no tener sexo durante la ovulación. Normalmente, la mujer comprueba la ovulación al observar cambios en su temperatura y en la consistencia del moco cervical. °Método posovulación °En este método, la pareja espera a que finalice la ovulación para tener sexo. °Resumen °· La anticoncepción, o los métodos anticonceptivos, hace referencia a los métodos o dispositivos que evitan el embarazo. °· Los métodos anticonceptivos hormonales incluyen implantes, inyecciones, pastillas, parches, anillos vaginales y anticonceptivos de emergencia. °· Los métodos anticonceptivos de barrera pueden incluir preservativos masculinos, preservativos femeninos, diafragmas, capuchones cervicales, esponjas y espermicidas. °· Existen dos tipos de DIU (dispositivo intrauterino). Un DIU puede colocarse en el útero de una mujer para evitar el embarazo durante 3 a 5 años. °· La esterilización permanente puede realizarse mediante un procedimiento para hombres, mujeres o ambos. °· Los métodos de planificación familiar natural incluyen no tener sexo durante los días en que la mujer podría quedar embarazada. °Esta información no tiene como fin reemplazar el consejo del médico. Asegúrese de hacerle al médico cualquier pregunta que tenga. °Document Revised: 03/16/2017 Document Reviewed: 06/04/2016 °Elsevier Patient Education © 2020 [employer] Inc. ° ° °Lactancia  materna °Breastfeeding ° °Decidir amamantar es una de las mejores elecciones que puede hacer por usted y su bebé. Un cambio en las hormonas durante el embarazo hace que las mamas produzcan leche materna en las glándulas productoras de leche. Las hormonas impiden que la leche materna sea liberada antes del nacimiento del bebé. Además, impulsan el flujo de leche luego del nacimiento. Una vez que ha comenzado a amamantar, pensar en el bebé, así como la succión o el llanto, pueden estimular la liberación de leche de las glándulas productoras de leche. °Los beneficios de amamantar °Las investigaciones demuestran que la lactancia materna ofrece muchos beneficios de salud para bebés y madres. Además, ofrece una forma gratuita y conveniente de alimentar al bebé. °Para el bebé °· La primera leche (calostro) ayuda a mejorar el funcionamiento del aparato digestivo del bebé. °· Las células especiales de la leche (anticuerpos) ayudan a combatir las infecciones en el bebé. °· Los bebés que se alimentan con   leche materna también tienen menos probabilidades de tener asma, alergias, obesidad o diabetes de tipo 2. Además, tienen menor riesgo de sufrir el síndrome de muerte súbita del lactante (SMSL). °· Los nutrientes de la leche materna son mejores para satisfacer las necesidades del bebé en comparación con la leche maternizada. °· La leche materna mejora el desarrollo cerebral del bebé. °Para usted °· La lactancia materna favorece el desarrollo de un vínculo muy especial entre la madre y el bebé. °· Es conveniente. La leche materna es económica y siempre está disponible a la temperatura correcta. °· La lactancia materna ayuda a quemar calorías. Le ayuda a perder el peso ganado durante el embarazo. °· Hace que el útero vuelva al tamaño que tenía antes del embarazo más rápido. Además, disminuye el sangrado (loquios) después del parto. °· La lactancia materna contribuye a reducir el riesgo de tener diabetes de tipo 2, osteoporosis,  artritis reumatoide, enfermedades cardiovasculares y cáncer de mama, ovario, útero y endometrio en el futuro. °Información básica sobre la lactancia °Comienzo de la lactancia °· Encuentre un lugar cómodo para sentarse o acostarse, con un buen respaldo para el cuello y la espalda. °· Coloque una almohada o una manta enrollada debajo del bebé para acomodarlo a la altura de la mama (si está sentada). Las almohadas para amamantar se han diseñado especialmente a fin de servir de apoyo para los brazos y el bebé mientras amamanta. °· Asegúrese de que la barriga del bebé (abdomen) esté frente a la suya. °· Masajee suavemente la mama. Con las yemas de los dedos, masajee los bordes exteriores de la mama hacia adentro, en dirección al pezón. Esto estimula el flujo de leche. Si la leche fluye lentamente, es posible que deba continuar con este movimiento durante la lactancia. °· Sostenga la mama con 4 dedos por debajo y el pulgar por arriba del pezón (forme la letra “C” con la mano). Asegúrese de que los dedos se encuentren lejos del pezón y de la boca del bebé. °· Empuje suavemente los labios del bebé con el pezón o con el dedo. °· Cuando la boca del bebé se abra lo suficiente, acérquelo rápidamente a la mama e introduzca todo el pezón y la aréola, tanto como sea posible, dentro de la boca del bebé. La aréola es la zona de color que rodea al pezón. °? Debe haber más aréola visible por arriba del labio superior del bebé que por debajo del labio inferior. °? Los labios del bebé deben estar abiertos y extendidos hacia afuera (evertidos) para asegurar que el bebé se prenda de forma adecuada y cómoda. °? La lengua del bebé debe estar entre la encía inferior y la mama. °· Asegúrese de que la boca del bebé esté en la posición correcta alrededor del pezón (prendido). Los labios del bebé deben crear un sello sobre la mama y estar doblados hacia afuera (invertidos). °· Es común que el bebé succione durante 2 a 3 minutos para que comience  el flujo de leche materna. °Cómo debe prenderse °Es muy importante que le enseñe al bebé cómo prenderse adecuadamente a la mama. Si el bebé no se prende adecuadamente, puede causar dolor en los pezones, reducir la producción de leche materna y hacer que el bebé tenga un escaso aumento de peso. Además, si el bebé no se prende adecuadamente al pezón, puede tragar aire durante la alimentación. Esto puede causarle molestias al bebé. Hacer eructar al bebé al cambiar de mama puede ayudarlo a liberar el aire. Sin embargo, enseñarle al bebé   cómo prenderse a la mama adecuadamente es la mejor manera de evitar que se sienta molesto por tragar aire mientras se alimenta. °Signos de que el bebé se ha prendido adecuadamente al pezón °· Tironea o succiona de modo silencioso, sin causarle dolor. Los labios del bebé deben estar extendidos hacia afuera (evertidos). °· Se escucha que traga cada 3 o 4 succiones una vez que la leche ha comenzado a fluir (después de que se produzca el reflejo de eyección de la leche). °· Hay movimientos musculares por arriba y por delante de sus oídos al succionar. °Signos de que el bebé no se ha prendido adecuadamente al pezón °· Hace ruidos de succión o de chasquido mientras se alimenta. °· Siente dolor en los pezones. °Si cree que el bebé no se prendió correctamente, deslice el dedo en la comisura de la boca y colóquelo entre las encías del bebé para interrumpir la succión. Intente volver a comenzar a amamantar. °Signos de lactancia materna exitosa °Signos del bebé °· El bebé disminuirá gradualmente el número de succiones o dejará de succionar por completo. °· El bebé se quedará dormido. °· El cuerpo del bebé se relajará. °· El bebé retendrá una pequeña cantidad de leche en la boca. °· El bebé se desprenderá solo del pecho. °Signos que presenta usted °· Las mamas han aumentado la firmeza, el peso y el tamaño 1 a 3 horas después de amamantar. °· Están más blandas inmediatamente después de  amamantar. °· Se producen un aumento del volumen de leche y un cambio en su consistencia y color hacia el quinto día de lactancia. °· Los pezones no duelen, no están agrietados ni sangran. °Signos de que su bebé recibe la cantidad de leche suficiente °· Mojar por lo menos 1 o 2 pañales durante las primeras 24 horas después del nacimiento. °· Mojar por lo menos 5 o 6 pañales cada 24 horas durante la primera semana después del nacimiento. La orina debe ser clara o de color amarillo pálido a los 5 días de vida. °· Mojar entre 6 y 8 pañales cada 24 horas a medida que el bebé sigue creciendo y desarrollándose. °· Defeca por lo menos 3 veces en 24 horas a los 5 días de vida. Las heces deben ser blandas y amarillentas. °· Defeca por lo menos 3 veces en 24 horas a los 7 días de vida. Las heces deben ser grumosas y amarillentas. °· No registra una pérdida de peso mayor al 10 % del peso al nacer durante los primeros 3 días de vida. °· Aumenta de peso un promedio de 4 a 7 onzas (113 a 198 g) por semana después de los 4 días de vida. °· Aumenta de peso, diariamente, de manera uniforme a partir de los 5 días de vida, sin registrar pérdida de peso después de las 2 semanas de vida. °Después de alimentarse, es posible que el bebé regurgite una pequeña cantidad de leche. Esto es normal. °Frecuencia y duración de la lactancia °El amamantamiento frecuente la ayudará a producir más leche y puede prevenir dolores en los pezones y las mamas extremadamente llenas (congestión mamaria). Alimente al bebé cuando muestre signos de hambre o si siente la necesidad de reducir la congestión de las mamas. Esto se denomina "lactancia a demanda". Las señales de que el bebé tiene hambre incluyen las siguientes: °· Aumento del estado de alerta, actividad o inquietud. °· Mueve la cabeza de un lado a otro. °· Abre la boca cuando se le toca la mejilla o la comisura de la boca (reflejo de búsqueda). °· Aumenta las vocalizaciones, tales como   sonidos de  succión, se relame los labios, emite arrullos, suspiros o chirridos. °· Mueve la mano hacia la boca y se chupa los dedos o las manos. °· Está molesto o llora. °Evite el uso del chupete en las primeras 4 a 6 semanas después del nacimiento del bebé. Después de este período, podrá usar un chupete. Las investigaciones demostraron que el uso del chupete durante el primer año de vida del bebé disminuye el riesgo de tener el síndrome de muerte súbita del lactante (SMSL). °Permita que el niño se alimente en cada mama todo lo que desee. Cuando el bebé se desprende o se queda dormido mientras se está alimentando de la primera mama, ofrézcale la segunda. Debido a que, con frecuencia, los recién nacidos están somnolientos las primeras semanas de vida, es posible que deba despertar al bebé para alimentarlo. °Los horarios de lactancia varían de un bebé a otro. Sin embargo, las siguientes reglas pueden servir como guía para ayudarla a garantizar que el bebé se alimenta adecuadamente: °· Se puede amamantar a los recién nacidos (bebés de 4 semanas o menos de vida) cada 1 a 3 horas. °· No deben transcurrir más de 3 horas durante el día o 5 horas durante la noche sin que se amamante a los recién nacidos. °· Debe amamantar al bebé un mínimo de 8 veces en un período de 24 horas. °Extracción de leche materna ° °  ° °La extracción y el almacenamiento de la leche materna le permiten asegurarse de que el bebé se alimente exclusivamente de su leche materna, aun en momentos en los que no puede amamantar. Esto tiene especial importancia si debe regresar al trabajo en el período en que aún está amamantando o si no puede estar presente en los momentos en que el bebé debe alimentarse. Su asesor en lactancia puede ayudarla a encontrar un método de extracción que funcione mejor para usted y orientarla sobre cuánto tiempo es seguro almacenar leche materna. °Cómo cuidar las mamas durante la lactancia °Los pezones pueden secarse, agrietarse y doler  durante la lactancia. Las siguientes recomendaciones pueden ayudarla a mantener las mamas humectadas y sanas: °· Evite usar jabón en los pezones. °· Use un sostén de soporte diseñado especialmente para la lactancia materna. Evite usar sostenes con aro o sostenes muy ajustados (sostenes deportivos). °· Seque al aire sus pezones durante 3 a 4 minutos después de amamantar al bebé. °· Utilice solo apósitos de algodón en el sostén para absorber las pérdidas de leche. La pérdida de un poco de leche materna entre las tomas es normal. °· Utilice lanolina sobre los pezones luego de amamantar. La lanolina ayuda a mantener la humedad normal de la piel. La lanolina pura no es perjudicial (no es tóxica) para el bebé. Además, puede extraer manualmente algunas gotas de leche materna y masajear suavemente esa leche sobre los pezones para que la leche se seque al aire. °Durante las primeras semanas después del nacimiento, algunas mujeres experimentan congestión mamaria. La congestión mamaria puede hacer que sienta las mamas pesadas, calientes y sensibles al tacto. El pico de la congestión mamaria ocurre en el plazo de los 3 a 5 días después del parto. Las siguientes recomendaciones pueden ayudarla a aliviar la congestión mamaria: °· Vacíe por completo las mamas al amamantar o extraer leche. Puede aplicar calor húmedo en las mamas (en la ducha o con toallas húmedas para manos) antes de amamantar o extraer leche. Esto aumenta la circulación y ayuda a que la leche fluya. Si el bebé no vacía por   completo las mamas cuando lo amamanta, extraiga la leche restante después de que haya finalizado. °· Aplique compresas de hielo sobre las mamas inmediatamente después de amamantar o extraer leche, a menos que le resulte demasiado incómodo. Haga lo siguiente: °? Ponga el hielo en una bolsa plástica. °? Coloque una toalla entre la piel y la bolsa de hielo. °? Coloque el hielo durante 20 minutos, 2 o 3 veces por día. °· Asegúrese de que el bebé  esté prendido y se encuentre en la posición correcta mientras lo alimenta. °Si la congestión mamaria persiste luego de 48 horas o después de seguir estas recomendaciones, comuníquese con su médico o un asesor en lactancia. °Recomendaciones de salud general durante la lactancia °· Consuma 3 comidas y 3 colaciones saludables todos los días. Las madres bien alimentadas que amamantan necesitan entre 450 y 500 calorías adicionales por día. Puede cumplir con este requisito al aumentar la cantidad de una dieta equilibrada que realice. °· Beba suficiente agua para mantener la orina clara o de color amarillo pálido. °· Descanse con frecuencia, relájese y siga tomando sus vitaminas prenatales para prevenir la fatiga, el estrés y los niveles bajos de vitaminas y minerales en el cuerpo (deficiencias de nutrientes). °· No consuma ningún producto que contenga nicotina o tabaco, como cigarrillos y cigarrillos electrónicos. El bebé puede verse afectado por las sustancias químicas de los cigarrillos que pasan a la leche materna y por la exposición al humo ambiental del tabaco. Si necesita ayuda para dejar de fumar, consulte al médico. °· Evite el consumo de alcohol. °· No consuma drogas ilegales o marihuana. °· Antes de usar cualquier medicamento, hable con el médico. Estos incluyen medicamentos recetados y de venta libre, como también vitaminas y suplementos a base de hierbas. Algunos medicamentos, que pueden ser perjudiciales para el bebé, pueden pasar a través de la leche materna. °· Puede quedar embarazada durante la lactancia. Si se desea un método anticonceptivo, consulte al médico sobre cuáles son las opciones seguras durante la lactancia. °Dónde encontrar más información: °Liga internacional [place]: www.llli.org. °Comuníquese con un médico si: °· Siente que quiere dejar de amamantar o se siente frustrada con la lactancia. °· Sus pezones están agrietados o sangran. °· Sus mamas están irritadas, sensibles o  calientes. °· Tiene los siguientes síntomas: °? Dolor en las mamas o en los pezones. °? Un área hinchada en cualquiera de las mamas. °? Fiebre o escalofríos. °? Náuseas o vómitos. °? Drenaje de otro líquido distinto de la leche materna desde los pezones. °· Sus mamas no se llenan antes de amamantar al bebé para el quinto día después del parto. °· Se siente triste y deprimida. °· El bebé: °? Está demasiado somnoliento como para comer bien. °? Tiene problemas para dormir. °? Tiene más de 1 semana de vida y moja menos de 6 pañales en un periodo de 24 horas. °? No ha aumentado de peso a los 5 días de vida. °· El bebé defeca menos de 3 veces en 24 horas. °· La piel del bebé o las partes blancas de los ojos se vuelven amarillentas. °Solicite ayuda de inmediato si: °· El bebé está muy cansado (letargo) y no se quiere despertar para comer. °· Le sube la fiebre sin causa. °Resumen °· La lactancia materna ofrece muchos beneficios de salud para bebés y madres. °· Intente amamantar a su bebé cuando muestre signos tempranos de hambre. °· Haga cosquillas o empuje suavemente los labios del bebé con el dedo o el pezón para lograr que el bebé   abra la boca. Acerque el bebé a la mama. Asegúrese de que la mayor parte de la aréola se encuentre dentro de la boca del bebé. Ofrézcale una mama y haga eructar al bebé antes de pasar a la otra. °· Hable con su médico o asesor en lactancia si tiene dudas o problemas con la lactancia. °Esta información no tiene como fin reemplazar el consejo del médico. Asegúrese de hacerle al médico cualquier pregunta que tenga. °Document Revised: 05/09/2017 Document Reviewed: 06/04/2016 °Elsevier Patient Education © 2020 Elsevier Inc. ° °

## 2020-02-17 ENCOUNTER — Ambulatory Visit: Payer: Self-pay

## 2020-02-27 NOTE — L&D Delivery Note (Addendum)
OB/GYN Faculty Practice Delivery Note  Suzanne Estes is a 40 y.o. [id number] s/p vaginal delivery at [redacted]w[redacted]d. She was admitted for IOL for A2GDM.   ROM: 2h 37m with clear fluid GBS Status:  Negative/-- (03/10 0837) Maximum Maternal Temperature:  Temp (24hrs), Avg:98.3 F (36.8 C), Min:98.1 F (36.7 C), Max:98.5 F (36.9 C)  Labor Progress: Patient arrived with cervical dilation of 1 cm. She received cytotec on admission and was then transitioned to pitocin. SROM for clear fluid at 2115 on 05/22/20 and then progressed to complete cervical dilation. She then had an uncomplicated delivery as noted below.  Delivery Date/Time: 05/23/2020 at 2344 Delivery: Called to room and patient was complete and pushing. Head delivered in LOA position. No nuchal cord present. Shoulder and body delivered in usual fashion. Infant with spontaneous cry, placed on mother's abdomen, dried and stimulated. Cord clamped x 2 after 1-minute delay, and cut by FOB. Cord blood drawn. Placenta delivered spontaneously with gentle cord traction. Fundus firm with massage and Pitocin. Labia, perineum, vagina, and cervix inspected with no lacerations.   Placenta: spontaneous, complete, 3 vessel cord, sent to L&D Complications:None  Lacerations: None EBL: 125 ml s/p TXA and lower uterine sweep Analgesia: epidural    Infant: Viable female APGAR (1 MIN): 9   APGAR (5 MINS): 9    Weight: per medical record  Derrel Nip, MD  PGY-2, [facility]  05/23/2020 12:10 AM  I was present and gloved for delivery of infant and placenta. I agree with resident's documentation as noted above.  Sheila Oats, MD OB Fellow, Faculty Practice 05/23/2020 1:35 AM

## 2020-03-08 ENCOUNTER — Other Ambulatory Visit: Payer: Self-pay

## 2020-03-08 DIAGNOSIS — O0990 Supervision of high risk pregnancy, unspecified, unspecified trimester: Secondary | ICD-10-CM

## 2020-03-09 ENCOUNTER — Other Ambulatory Visit: Payer: Self-pay

## 2020-03-09 ENCOUNTER — Encounter: Payer: Self-pay | Admitting: Advanced Practice Midwife

## 2020-03-09 ENCOUNTER — Ambulatory Visit (INDEPENDENT_AMBULATORY_CARE_PROVIDER_SITE_OTHER): Payer: Self-pay | Admitting: Advanced Practice Midwife

## 2020-03-09 VITALS — BP 120/79 | HR 87 | Wt 148.6 lb

## 2020-03-09 DIAGNOSIS — O0990 Supervision of high risk pregnancy, unspecified, unspecified trimester: Secondary | ICD-10-CM

## 2020-03-09 DIAGNOSIS — O0993 Supervision of high risk pregnancy, unspecified, third trimester: Secondary | ICD-10-CM

## 2020-03-09 DIAGNOSIS — O09523 Supervision of elderly multigravida, third trimester: Secondary | ICD-10-CM

## 2020-03-09 DIAGNOSIS — Z3A28 28 weeks gestation of pregnancy: Secondary | ICD-10-CM

## 2020-03-09 NOTE — Patient Instructions (Signed)
Ultrasound Appt on 03/21/2020 at 10:30

## 2020-03-09 NOTE — Progress Notes (Signed)
   PRENATAL VISIT NOTE  Subjective:  Suzanne Estes is a 40 y.o. [phone] at [redacted]w[redacted]d being seen today for ongoing prenatal care.  She is currently monitored for the following issues for this high-risk pregnancy and has Acute appendicitis; Supervision of high risk pregnancy, antepartum; AMA (advanced maternal age) multigravida 35+; Language barrier; Choroid plexus cysts, fetal, affecting care of mother, antepartum; and Family history of malignant neoplasm of gastrointestinal tract on their problem list.  Patient reports no complaints.  Contractions: Irritability. Vag. Bleeding: None.  Movement: Present. Denies leaking of fluid.   The following portions of the patient's history were reviewed and updated as appropriate: allergies, current medications, past family history, past medical history, past social history, past surgical history and problem list.   Objective:   Vitals:   03/09/20 0844  BP: 120/79  Pulse: 87  Weight: 148 lb 9.6 oz (67.4 kg)    Fetal Status: Fetal Heart Rate (bpm): 154 Fundal Height: 28 cm Movement: Present     General:  Alert, oriented and cooperative. Patient is in no acute distress.  Skin: Skin is warm and dry. No rash noted.   Cardiovascular: Normal heart rate noted  Respiratory: Normal respiratory effort, no problems with respiration noted  Abdomen: Soft, gravid, appropriate for gestational age.  Pain/Pressure: Present     Pelvic: Cervical exam deferred        Extremities: Normal range of motion.  Edema: None  Mental Status: Normal mood and affect. Normal behavior. Normal judgment and thought content.   Assessment and Plan:  Pregnancy: G5P3013 at [redacted]w[redacted]d 1. [redacted] weeks gestation of pregnancy - GTT today   2. Supervision of high risk pregnancy in third trimester   3. Multigravida of advanced maternal age in third trimester - Patient is > 40 weeks. Needs 28, 32 and 36 weeks [place] at [facility]. Also plan to start antenatal testing at 36 weeks  This is per  our MFM guidelines for antenatal testing - [place] scheduled at [facility] for 03/21/2020 at 10:30  Preterm labor symptoms and general obstetric precautions including but not limited to vaginal bleeding, contractions, leaking of fluid and fetal movement were reviewed in detail with the patient. Please refer to After Visit Summary for other counseling recommendations.   Return in about 2 weeks (around 03/23/2020) for virtual visit .  Future Appointments  Date Time Provider Department Center  03/09/2020  9:30 AM [facility]    Thressa Sheller DNP, CNM  03/09/20  9:28 AM

## 2020-03-09 NOTE — Progress Notes (Addendum)
[facility] [place] scheduled for 03/21/20 at 1030.

## 2020-03-10 LAB — CBC
Hematocrit: 34.1 % (ref 34.0–46.6)
Hemoglobin: 12 g/dL (ref 11.1–15.9)
MCH: 32 pg (ref 26.6–33.0)
MCHC: 35.2 g/dL (ref 31.5–35.7)
MCV: 91 fL (ref 79–97)
Platelets: 201 10*3/uL (ref 150–450)
RBC: 3.75 x10E6/uL — ABNORMAL LOW (ref 3.77–5.28)
RDW: 12.3 % (ref 11.7–15.4)
WBC: 8.2 10*3/uL (ref 3.4–10.8)

## 2020-03-10 LAB — GLUCOSE TOLERANCE, 2 HOURS W/ 1HR
Glucose, 1 hour: 213 mg/dL — ABNORMAL HIGH (ref 65–179)
Glucose, 2 hour: 176 mg/dL — ABNORMAL HIGH (ref 65–152)
Glucose, Fasting: 95 mg/dL — ABNORMAL HIGH (ref 65–91)

## 2020-03-10 LAB — HIV ANTIBODY (ROUTINE TESTING W REFLEX): HIV Screen 4th Generation wRfx: NONREACTIVE

## 2020-03-10 LAB — RPR: RPR Ser Ql: NONREACTIVE

## 2020-03-18 ENCOUNTER — Telehealth: Payer: Self-pay

## 2020-03-18 ENCOUNTER — Encounter: Payer: Self-pay | Admitting: Advanced Practice Midwife

## 2020-03-18 DIAGNOSIS — O24419 Gestational diabetes mellitus in pregnancy, unspecified control: Secondary | ICD-10-CM | POA: Insufficient documentation

## 2020-03-18 NOTE — Telephone Encounter (Signed)
-----   Message from Armando Reichert, CNM sent at 03/18/2020  8:09 AM EST ----- Patient has GDM. Please call her and scheduled with the DM educator

## 2020-03-18 NOTE — Telephone Encounter (Signed)
Called Pt using Spanish [employer] id# [phone], to advise that she has GDM, explained that she will be contacted to schedule an appointment with out DM Educator. Pt verbalized understanding

## 2020-03-25 ENCOUNTER — Telehealth (INDEPENDENT_AMBULATORY_CARE_PROVIDER_SITE_OTHER): Payer: Self-pay | Admitting: Family Medicine

## 2020-03-25 ENCOUNTER — Encounter: Payer: Self-pay | Admitting: Family Medicine

## 2020-03-25 VITALS — BP 116/75 | HR 85

## 2020-03-25 DIAGNOSIS — O3503X0 Maternal care for (suspected) central nervous system malformation or damage in fetus, choroid plexus cysts, not applicable or unspecified: Secondary | ICD-10-CM

## 2020-03-25 DIAGNOSIS — Z789 Other specified health status: Secondary | ICD-10-CM

## 2020-03-25 DIAGNOSIS — O0990 Supervision of high risk pregnancy, unspecified, unspecified trimester: Secondary | ICD-10-CM

## 2020-03-25 DIAGNOSIS — Z758 Other problems related to medical facilities and other health care: Secondary | ICD-10-CM

## 2020-03-25 DIAGNOSIS — O09523 Supervision of elderly multigravida, third trimester: Secondary | ICD-10-CM

## 2020-03-25 DIAGNOSIS — O350XX0 Maternal care for (suspected) central nervous system malformation in fetus, not applicable or unspecified: Secondary | ICD-10-CM

## 2020-03-25 DIAGNOSIS — Z3A30 30 weeks gestation of pregnancy: Secondary | ICD-10-CM

## 2020-03-25 DIAGNOSIS — O24410 Gestational diabetes mellitus in pregnancy, diet controlled: Secondary | ICD-10-CM

## 2020-03-25 NOTE — Progress Notes (Signed)
I connected with Suzanne Estes 03/25/20 at 10:35 AM EST by: MyChart video and verified that I am speaking with the correct person using two identifiers.  Patient is located at home and provider is located at [facility].     The purpose of this virtual visit is to provide medical care while limiting exposure to the novel coronavirus. I discussed the limitations, risks, security and privacy concerns of performing an evaluation and management service by telephone (unable to do video) and the availability of in person appointments. I also discussed with the patient that there may be a patient responsible charge related to this service. By engaging in this virtual visit, you consent to the provision of healthcare.  Additionally, you authorize for your insurance to be billed for the services provided during this visit.  The patient expressed understanding and agreed to proceed.  The following staff members participated in the virtual visit:  Venora Maples, MD/MPH    PRENATAL VISIT NOTE  Subjective:  Suzanne Estes is a 40 y.o. [id number] at [redacted]w[redacted]d  for phone visit for ongoing prenatal care.  She is currently monitored for the following issues for this high-risk pregnancy and has Acute appendicitis; Supervision of high risk pregnancy, antepartum; AMA (advanced maternal age) multigravida 35+; Language barrier; Choroid plexus cysts, fetal, affecting care of mother, antepartum; Family history of malignant neoplasm of gastrointestinal tract; and Gestational diabetes on their problem list.  Patient reports no complaints.  Contractions: Not present. Vag. Bleeding: None.  Movement: Present. Denies leaking of fluid.   The following portions of the patient's history were reviewed and updated as appropriate: allergies, current medications, past family history, past medical history, past social history, past surgical history and problem list.   Objective:   Vitals:   03/25/20 1036   BP: 116/75  Pulse: 85   Self-Obtained  Fetal Status:     Movement: Present     Assessment and Plan:  Pregnancy: G5P3013 at [redacted]w[redacted]d 1. Supervision of high risk pregnancy, antepartum BP normal, good fetal movement Reports she had [place] at [facility] on 02/25/2020 and 03/21/2020, we do not have the results for either yet, we will request them  2. Language barrier Spanish  3. Multigravida of advanced maternal age in third trimester   4. Choroid plexus cyst of fetus affecting care of mother, antepartum, single or unspecified fetus   5. Diet controlled gestational diabetes mellitus (GDM) in third trimester Long discussion Has not yet seen DM educator, appears she is scheduled for 03/31/2020 Husband has diabetes and she has been using his test strips but haphazardly Discussed qid testing, ideal blood sugar ranges, she will start doing this and recording values Reviewed possible risks of uncontrolled GDM including stillbirth, macrosomia, shoulder dystocia, etc. Also reviewed dietary changes briefly as she was unaware of foods to avoid Will see her in person in 2 weeks to measure fundal height, check sugars  Preterm labor symptoms and general obstetric precautions including but not limited to vaginal bleeding, contractions, leaking of fluid and fetal movement were reviewed in detail with the patient.  Return in 2 weeks (on 04/08/2020) for [facility], ob visit.  Future Appointments  Date Time Provider Department Center  03/31/2020  1:15 PM [facility]     Time spent on virtual visit: 16 minutes  Venora Maples, MD

## 2020-03-25 NOTE — Progress Notes (Signed)
I connected with  Suzanne Estes on 03/25/20 at 10:35 AM EST by telephone and verified that I am speaking with the correct person using two identifiers.   I discussed the limitations, risks, security and privacy concerns of performing an evaluation and management service by telephone and the availability of in person appointments. I also discussed with the patient that there may be a patient responsible charge related to this service. The patient expressed understanding and agreed to proceed.  Spanish Interpreter Raquel Faythe Casa, RN 03/25/2020  10:31 AM

## 2020-03-25 NOTE — Patient Instructions (Signed)
 Eleccin del mtodo anticonceptivo Contraception Choices La anticoncepcin, o los mtodos anticonceptivos, hace referencia a los mtodos o dispositivos que evitan el embarazo. Mtodos hormonales Implante anticonceptivo Un implante anticonceptivo consiste en un tubo delgado de plstico que contiene una hormona que evita el embarazo. Es diferente de un dispositivo intrauterino (DIU). Un mdico lo inserta en la parte superior del brazo. Los implantes pueden ser eficaces durante un mximo de 3 aos. Inyecciones de progestina sola Las inyecciones de progestina sola contienen progestina, una forma sinttica de la hormona progesterona. Un mdico las administra cada 3 meses. Pldoras anticonceptivas Las pldoras anticonceptivas son pastillas que contienen hormonas que evitan el embarazo. Deben tomarse una vez al da, preferentemente a la misma hora cada da. Se necesita una receta para utilizar este mtodo anticonceptivo. Parche anticonceptivo El parche anticonceptivo contiene hormonas que evitan el embarazo. Se coloca en la piel, debe cambiarse una vez a la semana durante tres semanas y debe retirarse en la cuarta semana. Se necesita una receta para utilizar este mtodo anticonceptivo. Anillo vaginal Un anillo vaginal contiene hormonas que evitan el embarazo. Se coloca en la vagina durante tres semanas y se retira en la cuarta semana. Luego se repite el proceso con un anillo nuevo. Se necesita una receta para utilizar este mtodo anticonceptivo. Anticonceptivo de emergencia Los anticonceptivos de emergencia son mtodos para evitar un embarazo despus de tener sexo sin proteccin. Vienen en forma de pldora y pueden tomarse hasta 5 das despus de tener sexo. Funcionan mejor cuando se toman lo ms pronto posible luego de tener sexo. La mayora de los anticonceptivos de emergencia estn disponibles sin receta mdica. Este mtodo no debe utilizarse como el nico mtodo anticonceptivo.   Mtodos de  barrera Condn masculino Un condn masculino es una vaina delgada que se coloca sobre el pene durante el sexo. Los condones evitan que el esperma ingrese en el cuerpo de la mujer. Pueden utilizarse con un una sustancia que mata a los espermatozoides (espermicida) para aumentar la efectividad. Deben desecharse despus de un uso. Condn femenino Un condn femenino es una vaina blanda y holgada que se coloca en la vagina antes de tener sexo. El condn evita que el esperma ingrese en el cuerpo de la mujer. Deben desecharse despus de un uso. Diafragma Un diafragma es una barrera blanda con forma de cpula. Se inserta en la vagina antes del sexo, junto con un espermicida. El diafragma bloquea el ingreso de esperma en el tero, y el espermicida mata a los espermatozoides. El diafragma debe permanecer en la vagina durante 6 a 8 horas despus de tener sexo y debe retirarse en el plazo de las 24 horas. Un diafragma es recetado y colocado por un mdico. Debe reemplazarse cada 1 a 2 aos, despus de dar a luz, de aumentar ms de 15lb (6.8kg) y de una ciruga plvica. Capuchn cervical Un capuchn cervical es una copa redonda y blanda de ltex o plstico que se coloca en el cuello uterino. Se inserta en la vagina antes del sexo, junto con un espermicida. Bloquea el ingreso del esperma en el tero. El capuchn debe permanecer en el lugar durante 6 a 8 horas despus de tener sexo y debe retirarse en el plazo de las 48 horas. Un capuchn cervical debe ser recetado y colocado por un mdico. Debe reemplazarse cada 2aos. Esponja Una esponja es una pieza blanda y circular de espuma de poliuretano que contiene espermicida. La esponja ayuda a bloquear el ingreso de esperma en el tero, y el   espermicida mata a los espermatozoides. Para utilizarla, debe humedecerla e insertarla en la vagina. Debe insertarse antes de tener sexo, debe permanecer dentro al menos durante 6 horas despus de tener sexo y debe retirarse y  desecharse en el plazo de las 30 horas. Espermicidas Los espermicidas son sustancias qumicas que matan o bloquean al esperma y no lo dejan ingresar al cuello uterino y al tero. Vienen en forma de crema, gel, supositorio, espuma o comprimido. Un espermicida debe insertarse en la vagina con un aplicador al menos 10 o 15 minutos antes de tener sexo para dar tiempo a que surta efecto. El proceso debe repetirse cada vez que tenga sexo. Los espermicidas no requieren receta mdica.   Anticonceptivos intrauterinos Dispositivo intrauterino (DIU) Un DIU es un dispositivo en forma de T que se coloca en el tero. Existen dos tipos:  DIU hormonal.Este tipo contiene progestina, una forma sinttica de la hormona progesterona. Este tipo puede permanecer colocado durante 3 a 5 aos.  DIU de cobre.Este tipo est recubierto con un alambre de cobre. Puede permanecer colocado durante 10 aos. Mtodos anticonceptivos permanentes Ligadura de trompas en la mujer En este mtodo, se sellan, atan u obstruyen las trompas de Falopio durante una ciruga para evitar que el vulo descienda hacia el tero. Esterilizacin histeroscpica En este mtodo, se coloca un implante pequeo y flexible dentro de cada trompa de Falopio. Los implantes hacen que se forme un tejido cicatricial en las trompas de Falopio y que las obstruya para que el espermatozoide no pueda llegar al vulo. El procedimiento demora alrededor de 3 meses para que sea efectivo. Debe utilizarse otro mtodo anticonceptivo durante esos 3 meses. Esterilizacin masculina Este es un procedimiento que consiste en atar los conductos que transportan el esperma (vasectoma). Luego del procedimiento, el hombre puede eyacular lquido (semen). Debe utilizarse otro mtodo anticonceptivo durante 3 meses despus del procedimiento. Mtodos de planificacin natural Planificacin familiar natural En este mtodo, la pareja no tiene sexo durante los das en que la mujer podra quedar  embarazada. Mtodo calendario En este mtodo, la mujer realiza un seguimiento de la duracin de cada ciclo menstrual, identifica los das en los que se puede producir un embarazo y no tiene sexo durante esos das. Mtodo de la ovulacin En este mtodo, la pareja evita tener sexo durante la ovulacin. Mtodo sintotrmico Este mtodo implica no tener sexo durante la ovulacin. Normalmente, la mujer comprueba la ovulacin al observar cambios en su temperatura y en la consistencia del moco cervical. Mtodo posovulacin En este mtodo, la pareja espera a que finalice la ovulacin para tener sexo. Dnde buscar ms informacin  Centers for Disease Control and Prevention (Centros para el Control y la Prevencin de Enfermedades): www.cdc.gov Resumen  La anticoncepcin, o los mtodos anticonceptivos, hace referencia a los mtodos o dispositivos que evitan el embarazo.  Los mtodos anticonceptivos hormonales incluyen implantes, inyecciones, pastillas, parches, anillos vaginales y anticonceptivos de emergencia.  Los mtodos anticonceptivos de barrera pueden incluir condones masculinos, condones femeninos, diafragmas, capuchones cervicales, esponjas y espermicidas.  Existen dos tipos de DIU (dispositivo intrauterino). Un DIU puede colocarse en el tero de una mujer para evitar el embarazo durante 3 a 5 aos.  La esterilizacin permanente puede realizarse mediante un procedimiento tanto en los hombres como en las mujeres. Los mtodos de planificacin familiar natural implican no tener sexo durante los das en que la mujer podra quedar embarazada. Esta informacin no tiene como fin reemplazar el consejo del mdico. Asegrese de hacerle al mdico cualquier pregunta que   tenga. Document Revised: 09/15/2019 Document Reviewed: 09/15/2019 Elsevier Patient Education  2021 [employer] Inc.   Lactancia materna Breastfeeding  Decidir amamantar es una de las mejores elecciones que puede hacer por usted y su  beb. Un cambio en las hormonas durante el embarazo hace que las mamas produzcan leche materna en las glndulas productoras de leche. Las hormonas impiden que la leche materna sea liberada antes del nacimiento del beb. Adems, impulsan el flujo de leche luego del nacimiento. Una vez que ha comenzado a amamantar, pensar en el beb, as como la succin o el llanto, pueden estimular la liberacin de leche de las glndulas productoras de leche. Los beneficios de amamantar Las investigaciones demuestran que la lactancia materna ofrece muchos beneficios de salud para bebs y madres. Adems, ofrece una forma gratuita y conveniente de alimentar al beb. Para el beb  La primera leche (calostro) ayuda a mejorar el funcionamiento del aparato digestivo del beb.  Las clulas especiales de la leche (anticuerpos) ayudan a combatir las infecciones en el beb.  Los bebs que se alimentan con leche materna tambin tienen menos probabilidades de tener asma, alergias, obesidad o diabetes de tipo 2. Adems, tienen menor riesgo de sufrir el sndrome de muerte sbita del lactante (SMSL).  Los nutrientes de la leche materna son mejores para satisfacer las necesidades del beb en comparacin con la leche maternizada.  La leche materna mejora el desarrollo cerebral del beb. Para usted  La lactancia materna favorece el desarrollo de un vnculo muy especial entre la madre y el beb.  Es conveniente. La leche materna es econmica y siempre est disponible a la temperatura correcta.  La lactancia materna ayuda a quemar caloras. Le ayuda a perder el peso ganado durante el embarazo.  Hace que el tero vuelva al tamao que tena antes del embarazo ms rpido. Adems, disminuye el sangrado (loquios) despus del parto.  La lactancia materna contribuye a reducir el riesgo de tener diabetes de tipo 2, osteoporosis, artritis reumatoide, enfermedades cardiovasculares y cncer de mama, ovario, tero y endometrio en el  futuro. Informacin bsica sobre la lactancia Comienzo de la lactancia  Encuentre un lugar cmodo para sentarse o acostarse, con un buen respaldo para el cuello y la espalda.  Coloque una almohada o una manta enrollada debajo del beb para acomodarlo a la altura de la mama (si est sentada). Las almohadas para amamantar se han diseado especialmente a fin de servir de apoyo para los brazos y el beb mientras amamanta.  Asegrese de que la barriga del beb (abdomen) est frente a la suya.  Masajee suavemente la mama. Con las yemas de los dedos, masajee los bordes exteriores de la mama hacia adentro, en direccin al pezn. Esto estimula el flujo de leche. Si la leche fluye lentamente, es posible que deba continuar con este movimiento durante la lactancia.  Sostenga la mama con 4 dedos por debajo y el pulgar por arriba del pezn (forme la letra "C" con la mano). Asegrese de que los dedos se encuentren lejos del pezn y de la boca del beb.  Empuje suavemente los labios del beb con el pezn o con el dedo.  Cuando la boca del beb se abra lo suficiente, acrquelo rpidamente a la mama e introduzca todo el pezn y la arola, tanto como sea posible, dentro de la boca del beb. La arola es la zona de color que rodea al pezn. ? Debe haber ms arola visible por arriba del labio superior del beb que por debajo del labio   inferior. ? Los labios del beb deben estar abiertos y extendidos hacia afuera (evertidos) para asegurar que el beb se prenda de forma adecuada y cmoda. ? La lengua del beb debe estar entre la enca inferior y la mama.  Asegrese de que la boca del beb est en la posicin correcta alrededor del pezn (prendido). Los labios del beb deben crear un sello sobre la mama y estar doblados hacia afuera (invertidos).  Es comn que el beb succione durante 2 a 3 minutos para que comience el flujo de leche materna. Cmo debe prenderse Es muy importante que le ensee al beb cmo  prenderse adecuadamente a la mama. Si el beb no se prende adecuadamente, puede causar dolor en los pezones, reducir la produccin de leche materna y hacer que el beb tenga un escaso aumento de peso. Adems, si el beb no se prende adecuadamente al pezn, puede tragar aire durante la alimentacin. Esto puede causarle molestias al beb. Hacer eructar al beb al cambiar de mama puede ayudarlo a liberar el aire. Sin embargo, ensearle al beb cmo prenderse a la mama adecuadamente es la mejor manera de evitar que se sienta molesto por tragar aire mientras se alimenta. Signos de que el beb se ha prendido adecuadamente al pezn  Tironea o succiona de modo silencioso, sin causarle dolor. Los labios del beb deben estar extendidos hacia afuera (evertidos).  Se escucha que traga cada 3 o 4 succiones una vez que la leche ha comenzado a fluir (despus de que se produzca el reflejo de eyeccin de la leche).  Hay movimientos musculares por arriba y por delante de sus odos al succionar. Signos de que el beb no se ha prendido adecuadamente al pezn  Hace ruidos de succin o de chasquido mientras se alimenta.  Siente dolor en los pezones. Si cree que el beb no se prendi correctamente, deslice el dedo en la comisura de la boca y colquelo entre las encas del beb para interrumpir la succin. Intente volver a comenzar a amamantar. Signos de lactancia materna exitosa Signos del beb  El beb disminuir gradualmente el nmero de succiones o dejar de succionar por completo.  El beb se quedar dormido.  El cuerpo del beb se relajar.  El beb retendr una pequea cantidad de leche en la boca.  El beb se desprender solo del pecho. Signos que presenta usted  Las mamas han aumentado la firmeza, el peso y el tamao 1 a 3 horas despus de amamantar.  Estn ms blandas inmediatamente despus de amamantar.  Se producen un aumento del volumen de leche y un cambio en su consistencia y color hacia el  quinto da de lactancia.  Los pezones no duelen, no estn agrietados ni sangran. Signos de que su beb recibe la cantidad de leche suficiente  Mojar por lo menos 1 o 2paales durante las primeras 24horas despus del nacimiento.  Mojar por lo menos 5 o 6paales cada 24horas durante la primera semana despus del nacimiento. La orina debe ser clara o de color amarillo plido a los 5das de vida.  Mojar entre 6 y 8paales cada 24horas a medida que el beb sigue creciendo y desarrollndose.  Defeca por lo menos 3 veces en 24 horas a los 5 das de vida. Las heces deben ser blandas y amarillentas.  Defeca por lo menos 3 veces en 24 horas a los 7 das de vida. Las heces deben ser grumosas y amarillentas.  No registra una prdida de peso mayor al 10% del peso al   nacer durante los primeros 3 das de vida.  Aumenta de peso un promedio de 4 a 7onzas (113 a 198g) por semana despus de los 4 das de vida.  Aumenta de peso, diariamente, de manera uniforme a partir de los 5 das de vida, sin registrar prdida de peso despus de las 2semanas de vida. Despus de alimentarse, es posible que el beb regurgite una pequea cantidad de leche. Esto es normal. Frecuencia y duracin de la lactancia El amamantamiento frecuente la ayudar a producir ms leche y puede prevenir dolores en los pezones y las mamas extremadamente llenas (congestin mamaria). Alimente al beb cuando muestre signos de hambre o si siente la necesidad de reducir la congestin de las mamas. Esto se denomina "lactancia a demanda". Las seales de que el beb tiene hambre incluyen las siguientes:  Aumento del estado de alerta, actividad o inquietud.  Mueve la cabeza de un lado a otro.  Abre la boca cuando se le toca la mejilla o la comisura de la boca (reflejo de bsqueda).  Aumenta las vocalizaciones, tales como sonidos de succin, se relame los labios, emite arrullos, suspiros o chirridos.  Mueve la mano hacia la boca y se chupa  los dedos o las manos.  Est molesto o llora. Evite el uso del chupete en las primeras 4 a 6 semanas despus del nacimiento del beb. Despus de este perodo, podr usar un chupete. Las investigaciones demostraron que el uso del chupete durante el primer ao de vida del beb disminuye el riesgo de tener el sndrome de muerte sbita del lactante (SMSL). Permita que el nio se alimente en cada mama todo lo que desee. Cuando el beb se desprende o se queda dormido mientras se est alimentando de la primera mama, ofrzcale la segunda. Debido a que, con frecuencia, los recin nacidos estn somnolientos las primeras semanas de vida, es posible que deba despertar al beb para alimentarlo. Los horarios de lactancia varan de un beb a otro. Sin embargo, las siguientes reglas pueden servir como gua para ayudarla a garantizar que el beb se alimenta adecuadamente:  Se puede amamantar a los recin nacidos (bebs de 4 semanas o menos de vida) cada 1 a 3 horas.  No deben transcurrir ms de 3 horas durante el da o 5 horas durante la noche sin que se amamante a los recin nacidos.  Debe amamantar al beb un mnimo de 8 veces en un perodo de 24 horas. Extraccin de leche materna La extraccin y el almacenamiento de la leche materna le permiten asegurarse de que el beb se alimente exclusivamente de su leche materna, aun en momentos en los que no puede amamantar. Esto tiene especial importancia si debe regresar al trabajo en el perodo en que an est amamantando o si no puede estar presente en los momentos en que el beb debe alimentarse. Su asesor en lactancia puede ayudarla a encontrar un mtodo de extraccin que funcione mejor para usted y orientarla sobre cunto tiempo es seguro almacenar leche materna.      Cmo cuidar las mamas durante la lactancia Los pezones pueden secarse, agrietarse y doler durante la lactancia. Las siguientes recomendaciones pueden ayudarla a mantener las mamas humectadas y  sanas:  Evite usar jabn en los pezones.  Use un sostn de soporte diseado especialmente para la lactancia materna. Evite usar sostenes con aro o sostenes muy ajustados (sostenes deportivos).  Seque al aire sus pezones durante 3 a 4minutos despus de amamantar al beb.  Utilice solo apsitos de algodn en   el sostn para absorber las prdidas de leche. La prdida de un poco de leche materna entre las tomas es normal.  Utilice lanolina sobre los pezones luego de amamantar. La lanolina ayuda a mantener la humedad normal de la piel. La lanolina pura no es perjudicial (no es txica) para el beb. Adems, puede extraer manualmente algunas gotas de leche materna y masajear suavemente esa leche sobre los pezones para que la leche se seque al aire. Durante las primeras semanas despus del nacimiento, algunas mujeres experimentan congestin mamaria. La congestin mamaria puede hacer que sienta las mamas pesadas, calientes y sensibles al tacto. El pico de la congestin mamaria ocurre en el plazo de los 3 a 5 das despus del parto. Las siguientes recomendaciones pueden ayudarla a aliviar la congestin mamaria:  Vace por completo las mamas al amamantar o extraer leche. Puede aplicar calor hmedo en las mamas (en la ducha o con toallas hmedas para manos) antes de amamantar o extraer leche. Esto aumenta la circulacin y ayuda a que la leche fluya. Si el beb no vaca por completo las mamas cuando lo amamanta, extraiga la leche restante despus de que haya finalizado.  Aplique compresas de hielo sobre las mamas inmediatamente despus de amamantar o extraer leche, a menos que le resulte demasiado incmodo. Haga lo siguiente: ? Ponga el hielo en una bolsa plstica. ? Coloque una toalla entre la piel y la bolsa de hielo. ? Coloque el hielo durante 20minutos, 2 o 3veces por da.  Asegrese de que el beb est prendido y se encuentre en la posicin correcta mientras lo alimenta. Si la congestin mamaria  persiste luego de 48 horas o despus de seguir estas recomendaciones, comunquese con su mdico o un asesor en lactancia. Recomendaciones de salud general durante la lactancia  Consuma 3 comidas y 3 colaciones saludables todos los das. Las madres bien alimentadas que amamantan necesitan entre 450 y 500 caloras adicionales por da. Puede cumplir con este requisito al aumentar la cantidad de una dieta equilibrada que realice.  Beba suficiente agua para mantener la orina clara o de color amarillo plido.  Descanse con frecuencia, reljese y siga tomando sus vitaminas prenatales para prevenir la fatiga, el estrs y los niveles bajos de vitaminas y minerales en el cuerpo (deficiencias de nutrientes).  No consuma ningn producto que contenga nicotina o tabaco, como cigarrillos y cigarrillos electrnicos. El beb puede verse afectado por las sustancias qumicas de los cigarrillos que pasan a la leche materna y por la exposicin al humo ambiental del tabaco. Si necesita ayuda para dejar de fumar, consulte al mdico.  Evite el consumo de alcohol.  No consuma drogas ilegales o marihuana.  Antes de usar cualquier medicamento, hable con el mdico. Estos incluyen medicamentos recetados y de venta libre, como tambin vitaminas y suplementos a base de hierbas. Algunos medicamentos, que pueden ser perjudiciales para el beb, pueden pasar a travs de la leche materna.  Puede quedar embarazada durante la lactancia. Si se desea un mtodo anticonceptivo, consulte al mdico sobre cules son las opciones seguras durante la lactancia. Dnde encontrar ms informacin: Liga internacional La Leche: www.llli.org. Comunquese con un mdico si:  Siente que quiere dejar de amamantar o se siente frustrada con la lactancia.  Sus pezones estn agrietados o sangran.  Sus mamas estn irritadas, sensibles o calientes.  Tiene los siguientes sntomas: ? Dolor en las mamas o en los pezones. ? Un rea hinchada en cualquiera  de las mamas. ? Fiebre o escalofros. ? Nuseas o vmitos. ?   Drenaje de otro lquido distinto de la leche materna desde los pezones.  Sus mamas no se llenan antes de amamantar al beb para el quinto da despus del parto.  Se siente triste y deprimida.  El beb: ? Est demasiado somnoliento como para comer bien. ? Tiene problemas para dormir. ? Tiene ms de 1 semana de vida y moja menos de 6 paales en un periodo de 24 horas. ? No ha aumentado de peso a los 5 das de vida.  El beb defeca menos de 3 veces en 24 horas.  La piel del beb o las partes blancas de los ojos se vuelven amarillentas. Solicite ayuda de inmediato si:  El beb est muy cansado (letargo) y no se quiere despertar para comer.  Le sube la fiebre sin causa. Resumen  La lactancia materna ofrece muchos beneficios de salud para bebs y madres.  Intente amamantar a su beb cuando muestre signos tempranos de hambre.  Haga cosquillas o empuje suavemente los labios del beb con el dedo o el pezn para lograr que el beb abra la boca. Acerque el beb a la mama. Asegrese de que la mayor parte de la arola se encuentre dentro de la boca del beb. Ofrzcale una mama y haga eructar al beb antes de pasar a la otra.  Hable con su mdico o asesor en lactancia si tiene dudas o problemas con la lactancia. Esta informacin no tiene como fin reemplazar el consejo del mdico. Asegrese de hacerle al mdico cualquier pregunta que tenga. Document Revised: 05/09/2017 Document Reviewed: 06/04/2016 Elsevier Patient Education  2021 [employer] Inc.  

## 2020-03-31 ENCOUNTER — Ambulatory Visit: Payer: Self-pay | Admitting: Registered"

## 2020-03-31 ENCOUNTER — Other Ambulatory Visit: Payer: Self-pay

## 2020-03-31 ENCOUNTER — Encounter: Payer: Self-pay | Attending: Family Medicine | Admitting: Registered"

## 2020-03-31 DIAGNOSIS — O24419 Gestational diabetes mellitus in pregnancy, unspecified control: Secondary | ICD-10-CM | POA: Insufficient documentation

## 2020-03-31 NOTE — Progress Notes (Signed)
Interpreter services provided by Yvetta Coder [phone] from [place]  Patient was seen on 03/31/2020 for Gestational Diabetes self-management. EDD 05/28/20; [redacted]w[redacted]d. Patient states no history of GDM. Diet history obtained. Patient eats variety of all food groups. Beverages include water, occasional juice, soda. Pt states she has reduced carbohydrate intake since diagnosis. Patient reports no structured physical activity. Pt states her husband has diabetes and familiar with checking blood sugar and carbohydrates.  The following learning objectives were met by the patient :   States the definition of Gestational Diabetes  States why dietary management is important in controlling blood glucose  Describes the effects of carbohydrates on blood glucose levels  Demonstrates ability to create a balanced meal plan  Demonstrates carbohydrate counting   States when to check blood glucose levels  Demonstrates proper blood glucose monitoring techniques  States the effect of stress and exercise on blood glucose levels  States the importance of limiting caffeine and abstaining from alcohol and smoking  Plan:  Aim for 3 Carbohydrate Choices per meal (45 grams) +/- 1 either way  Aim for 1-2 Carbohydrate Choices per snack Begin reading food labels for Total Carbohydrate of foods If OK with your MD, consider  increasing your activity level by walking, Arm Chair Exercises or other activity daily as tolerated Begin checking Blood Glucose before breakfast and 2 hours after first bite of breakfast, lunch and dinner as directed by MD  Bring Log Book/Sheet and meter to every medical appointment  Baby Scripts: (BS 2.0 not capable of glucose management at this time.) Patient to record blood sugar on glucose log sheet  Take medication if directed by MD  Blood glucose monitor given: Pro[employer] Lot # [id number] CBG: 86 mg/dL  Patient instructed to monitor glucose levels: FBS: 60 - 95 mg/dl 2 hour: <120  mg/dl  Patient received the following handouts:  Nutrition Diabetes and Pregnancy  Carbohydrate Counting List  Blood glucose Log Sheet  Patient will be seen for follow-up in 1 week or as needed.

## 2020-04-08 ENCOUNTER — Ambulatory Visit (INDEPENDENT_AMBULATORY_CARE_PROVIDER_SITE_OTHER): Payer: Self-pay | Admitting: Family Medicine

## 2020-04-08 ENCOUNTER — Encounter: Payer: Self-pay | Admitting: Family Medicine

## 2020-04-08 ENCOUNTER — Other Ambulatory Visit: Payer: Self-pay

## 2020-04-08 ENCOUNTER — Other Ambulatory Visit: Payer: Self-pay | Admitting: *Deleted

## 2020-04-08 VITALS — BP 119/69 | HR 89 | Wt 150.4 lb

## 2020-04-08 DIAGNOSIS — O24419 Gestational diabetes mellitus in pregnancy, unspecified control: Secondary | ICD-10-CM

## 2020-04-08 DIAGNOSIS — O0990 Supervision of high risk pregnancy, unspecified, unspecified trimester: Secondary | ICD-10-CM

## 2020-04-08 DIAGNOSIS — Z789 Other specified health status: Secondary | ICD-10-CM

## 2020-04-08 DIAGNOSIS — O09523 Supervision of elderly multigravida, third trimester: Secondary | ICD-10-CM

## 2020-04-08 LAB — POCT URINALYSIS DIP (DEVICE)
Bilirubin Urine: NEGATIVE
Glucose, UA: NEGATIVE mg/dL
Hgb urine dipstick: NEGATIVE
Ketones, ur: NEGATIVE mg/dL
Nitrite: NEGATIVE
Protein, ur: NEGATIVE mg/dL
Specific Gravity, Urine: 1.015 (ref 1.005–1.030)
Urobilinogen, UA: 0.2 mg/dL (ref 0.0–1.0)
pH: 7 (ref 5.0–8.0)

## 2020-04-08 MED ORDER — METFORMIN HCL 500 MG PO TABS: 500.0000 mg | ORAL_TABLET | Freq: Two times a day (BID) | ORAL | 5 refills | Status: DC

## 2020-04-08 NOTE — Patient Instructions (Signed)
 Eleccin del mtodo anticonceptivo Contraception Choices La anticoncepcin, o los mtodos anticonceptivos, hace referencia a los mtodos o dispositivos que evitan el embarazo. Mtodos hormonales Implante anticonceptivo Un implante anticonceptivo consiste en un tubo delgado de plstico que contiene una hormona que evita el embarazo. Es diferente de un dispositivo intrauterino (DIU). Un mdico lo inserta en la parte superior del brazo. Los implantes pueden ser eficaces durante un mximo de 3 aos. Inyecciones de progestina sola Las inyecciones de progestina sola contienen progestina, una forma sinttica de la hormona progesterona. Un mdico las administra cada 3 meses. Pldoras anticonceptivas Las pldoras anticonceptivas son pastillas que contienen hormonas que evitan el embarazo. Deben tomarse una vez al da, preferentemente a la misma hora cada da. Se necesita una receta para utilizar este mtodo anticonceptivo. Parche anticonceptivo El parche anticonceptivo contiene hormonas que evitan el embarazo. Se coloca en la piel, debe cambiarse una vez a la semana durante tres semanas y debe retirarse en la cuarta semana. Se necesita una receta para utilizar este mtodo anticonceptivo. Anillo vaginal Un anillo vaginal contiene hormonas que evitan el embarazo. Se coloca en la vagina durante tres semanas y se retira en la cuarta semana. Luego se repite el proceso con un anillo nuevo. Se necesita una receta para utilizar este mtodo anticonceptivo. Anticonceptivo de emergencia Los anticonceptivos de emergencia son mtodos para evitar un embarazo despus de tener sexo sin proteccin. Vienen en forma de pldora y pueden tomarse hasta 5 das despus de tener sexo. Funcionan mejor cuando se toman lo ms pronto posible luego de tener sexo. La mayora de los anticonceptivos de emergencia estn disponibles sin receta mdica. Este mtodo no debe utilizarse como el nico mtodo anticonceptivo.   Mtodos de  barrera Condn masculino Un condn masculino es una vaina delgada que se coloca sobre el pene durante el sexo. Los condones evitan que el esperma ingrese en el cuerpo de la mujer. Pueden utilizarse con un una sustancia que mata a los espermatozoides (espermicida) para aumentar la efectividad. Deben desecharse despus de un uso. Condn femenino Un condn femenino es una vaina blanda y holgada que se coloca en la vagina antes de tener sexo. El condn evita que el esperma ingrese en el cuerpo de la mujer. Deben desecharse despus de un uso. Diafragma Un diafragma es una barrera blanda con forma de cpula. Se inserta en la vagina antes del sexo, junto con un espermicida. El diafragma bloquea el ingreso de esperma en el tero, y el espermicida mata a los espermatozoides. El diafragma debe permanecer en la vagina durante 6 a 8 horas despus de tener sexo y debe retirarse en el plazo de las 24 horas. Un diafragma es recetado y colocado por un mdico. Debe reemplazarse cada 1 a 2 aos, despus de dar a luz, de aumentar ms de 15lb (6.8kg) y de una ciruga plvica. Capuchn cervical Un capuchn cervical es una copa redonda y blanda de ltex o plstico que se coloca en el cuello uterino. Se inserta en la vagina antes del sexo, junto con un espermicida. Bloquea el ingreso del esperma en el tero. El capuchn debe permanecer en el lugar durante 6 a 8 horas despus de tener sexo y debe retirarse en el plazo de las 48 horas. Un capuchn cervical debe ser recetado y colocado por un mdico. Debe reemplazarse cada 2aos. Esponja Una esponja es una pieza blanda y circular de espuma de poliuretano que contiene espermicida. La esponja ayuda a bloquear el ingreso de esperma en el tero, y el   espermicida mata a los espermatozoides. Para utilizarla, debe humedecerla e insertarla en la vagina. Debe insertarse antes de tener sexo, debe permanecer dentro al menos durante 6 horas despus de tener sexo y debe retirarse y  desecharse en el plazo de las 30 horas. Espermicidas Los espermicidas son sustancias qumicas que matan o bloquean al esperma y no lo dejan ingresar al cuello uterino y al tero. Vienen en forma de crema, gel, supositorio, espuma o comprimido. Un espermicida debe insertarse en la vagina con un aplicador al menos 10 o 15 minutos antes de tener sexo para dar tiempo a que surta efecto. El proceso debe repetirse cada vez que tenga sexo. Los espermicidas no requieren receta mdica.   Anticonceptivos intrauterinos Dispositivo intrauterino (DIU) Un DIU es un dispositivo en forma de T que se coloca en el tero. Existen dos tipos:  DIU hormonal.Este tipo contiene progestina, una forma sinttica de la hormona progesterona. Este tipo puede permanecer colocado durante 3 a 5 aos.  DIU de cobre.Este tipo est recubierto con un alambre de cobre. Puede permanecer colocado durante 10 aos. Mtodos anticonceptivos permanentes Ligadura de trompas en la mujer En este mtodo, se sellan, atan u obstruyen las trompas de Falopio durante una ciruga para evitar que el vulo descienda hacia el tero. Esterilizacin histeroscpica En este mtodo, se coloca un implante pequeo y flexible dentro de cada trompa de Falopio. Los implantes hacen que se forme un tejido cicatricial en las trompas de Falopio y que las obstruya para que el espermatozoide no pueda llegar al vulo. El procedimiento demora alrededor de 3 meses para que sea efectivo. Debe utilizarse otro mtodo anticonceptivo durante esos 3 meses. Esterilizacin masculina Este es un procedimiento que consiste en atar los conductos que transportan el esperma (vasectoma). Luego del procedimiento, el hombre puede eyacular lquido (semen). Debe utilizarse otro mtodo anticonceptivo durante 3 meses despus del procedimiento. Mtodos de planificacin natural Planificacin familiar natural En este mtodo, la pareja no tiene sexo durante los das en que la mujer podra quedar  embarazada. Mtodo calendario En este mtodo, la mujer realiza un seguimiento de la duracin de cada ciclo menstrual, identifica los das en los que se puede producir un embarazo y no tiene sexo durante esos das. Mtodo de la ovulacin En este mtodo, la pareja evita tener sexo durante la ovulacin. Mtodo sintotrmico Este mtodo implica no tener sexo durante la ovulacin. Normalmente, la mujer comprueba la ovulacin al observar cambios en su temperatura y en la consistencia del moco cervical. Mtodo posovulacin En este mtodo, la pareja espera a que finalice la ovulacin para tener sexo. Dnde buscar ms informacin  Centers for Disease Control and Prevention (Centros para el Control y la Prevencin de Enfermedades): www.cdc.gov Resumen  La anticoncepcin, o los mtodos anticonceptivos, hace referencia a los mtodos o dispositivos que evitan el embarazo.  Los mtodos anticonceptivos hormonales incluyen implantes, inyecciones, pastillas, parches, anillos vaginales y anticonceptivos de emergencia.  Los mtodos anticonceptivos de barrera pueden incluir condones masculinos, condones femeninos, diafragmas, capuchones cervicales, esponjas y espermicidas.  Existen dos tipos de DIU (dispositivo intrauterino). Un DIU puede colocarse en el tero de una mujer para evitar el embarazo durante 3 a 5 aos.  La esterilizacin permanente puede realizarse mediante un procedimiento tanto en los hombres como en las mujeres. Los mtodos de planificacin familiar natural implican no tener sexo durante los das en que la mujer podra quedar embarazada. Esta informacin no tiene como fin reemplazar el consejo del mdico. Asegrese de hacerle al mdico cualquier pregunta que   tenga. Document Revised: 09/15/2019 Document Reviewed: 09/15/2019 Elsevier Patient Education  2021 [employer] Inc.   Lactancia materna Breastfeeding  Decidir amamantar es una de las mejores elecciones que puede hacer por usted y su  beb. Un cambio en las hormonas durante el embarazo hace que las mamas produzcan leche materna en las glndulas productoras de leche. Las hormonas impiden que la leche materna sea liberada antes del nacimiento del beb. Adems, impulsan el flujo de leche luego del nacimiento. Una vez que ha comenzado a amamantar, pensar en el beb, as como la succin o el llanto, pueden estimular la liberacin de leche de las glndulas productoras de leche. Los beneficios de amamantar Las investigaciones demuestran que la lactancia materna ofrece muchos beneficios de salud para bebs y madres. Adems, ofrece una forma gratuita y conveniente de alimentar al beb. Para el beb  La primera leche (calostro) ayuda a mejorar el funcionamiento del aparato digestivo del beb.  Las clulas especiales de la leche (anticuerpos) ayudan a combatir las infecciones en el beb.  Los bebs que se alimentan con leche materna tambin tienen menos probabilidades de tener asma, alergias, obesidad o diabetes de tipo 2. Adems, tienen menor riesgo de sufrir el sndrome de muerte sbita del lactante (SMSL).  Los nutrientes de la leche materna son mejores para satisfacer las necesidades del beb en comparacin con la leche maternizada.  La leche materna mejora el desarrollo cerebral del beb. Para usted  La lactancia materna favorece el desarrollo de un vnculo muy especial entre la madre y el beb.  Es conveniente. La leche materna es econmica y siempre est disponible a la temperatura correcta.  La lactancia materna ayuda a quemar caloras. Le ayuda a perder el peso ganado durante el embarazo.  Hace que el tero vuelva al tamao que tena antes del embarazo ms rpido. Adems, disminuye el sangrado (loquios) despus del parto.  La lactancia materna contribuye a reducir el riesgo de tener diabetes de tipo 2, osteoporosis, artritis reumatoide, enfermedades cardiovasculares y cncer de mama, ovario, tero y endometrio en el  futuro. Informacin bsica sobre la lactancia Comienzo de la lactancia  Encuentre un lugar cmodo para sentarse o acostarse, con un buen respaldo para el cuello y la espalda.  Coloque una almohada o una manta enrollada debajo del beb para acomodarlo a la altura de la mama (si est sentada). Las almohadas para amamantar se han diseado especialmente a fin de servir de apoyo para los brazos y el beb mientras amamanta.  Asegrese de que la barriga del beb (abdomen) est frente a la suya.  Masajee suavemente la mama. Con las yemas de los dedos, masajee los bordes exteriores de la mama hacia adentro, en direccin al pezn. Esto estimula el flujo de leche. Si la leche fluye lentamente, es posible que deba continuar con este movimiento durante la lactancia.  Sostenga la mama con 4 dedos por debajo y el pulgar por arriba del pezn (forme la letra "C" con la mano). Asegrese de que los dedos se encuentren lejos del pezn y de la boca del beb.  Empuje suavemente los labios del beb con el pezn o con el dedo.  Cuando la boca del beb se abra lo suficiente, acrquelo rpidamente a la mama e introduzca todo el pezn y la arola, tanto como sea posible, dentro de la boca del beb. La arola es la zona de color que rodea al pezn. ? Debe haber ms arola visible por arriba del labio superior del beb que por debajo del labio   inferior. ? Los labios del beb deben estar abiertos y extendidos hacia afuera (evertidos) para asegurar que el beb se prenda de forma adecuada y cmoda. ? La lengua del beb debe estar entre la enca inferior y la mama.  Asegrese de que la boca del beb est en la posicin correcta alrededor del pezn (prendido). Los labios del beb deben crear un sello sobre la mama y estar doblados hacia afuera (invertidos).  Es comn que el beb succione durante 2 a 3 minutos para que comience el flujo de leche materna. Cmo debe prenderse Es muy importante que le ensee al beb cmo  prenderse adecuadamente a la mama. Si el beb no se prende adecuadamente, puede causar dolor en los pezones, reducir la produccin de leche materna y hacer que el beb tenga un escaso aumento de peso. Adems, si el beb no se prende adecuadamente al pezn, puede tragar aire durante la alimentacin. Esto puede causarle molestias al beb. Hacer eructar al beb al cambiar de mama puede ayudarlo a liberar el aire. Sin embargo, ensearle al beb cmo prenderse a la mama adecuadamente es la mejor manera de evitar que se sienta molesto por tragar aire mientras se alimenta. Signos de que el beb se ha prendido adecuadamente al pezn  Tironea o succiona de modo silencioso, sin causarle dolor. Los labios del beb deben estar extendidos hacia afuera (evertidos).  Se escucha que traga cada 3 o 4 succiones una vez que la leche ha comenzado a fluir (despus de que se produzca el reflejo de eyeccin de la leche).  Hay movimientos musculares por arriba y por delante de sus odos al succionar. Signos de que el beb no se ha prendido adecuadamente al pezn  Hace ruidos de succin o de chasquido mientras se alimenta.  Siente dolor en los pezones. Si cree que el beb no se prendi correctamente, deslice el dedo en la comisura de la boca y colquelo entre las encas del beb para interrumpir la succin. Intente volver a comenzar a amamantar. Signos de lactancia materna exitosa Signos del beb  El beb disminuir gradualmente el nmero de succiones o dejar de succionar por completo.  El beb se quedar dormido.  El cuerpo del beb se relajar.  El beb retendr una pequea cantidad de leche en la boca.  El beb se desprender solo del pecho. Signos que presenta usted  Las mamas han aumentado la firmeza, el peso y el tamao 1 a 3 horas despus de amamantar.  Estn ms blandas inmediatamente despus de amamantar.  Se producen un aumento del volumen de leche y un cambio en su consistencia y color hacia el  quinto da de lactancia.  Los pezones no duelen, no estn agrietados ni sangran. Signos de que su beb recibe la cantidad de leche suficiente  Mojar por lo menos 1 o 2paales durante las primeras 24horas despus del nacimiento.  Mojar por lo menos 5 o 6paales cada 24horas durante la primera semana despus del nacimiento. La orina debe ser clara o de color amarillo plido a los 5das de vida.  Mojar entre 6 y 8paales cada 24horas a medida que el beb sigue creciendo y desarrollndose.  Defeca por lo menos 3 veces en 24 horas a los 5 das de vida. Las heces deben ser blandas y amarillentas.  Defeca por lo menos 3 veces en 24 horas a los 7 das de vida. Las heces deben ser grumosas y amarillentas.  No registra una prdida de peso mayor al 10% del peso al   nacer durante los primeros 3 das de vida.  Aumenta de peso un promedio de 4 a 7onzas (113 a 198g) por semana despus de los 4 das de vida.  Aumenta de peso, diariamente, de manera uniforme a partir de los 5 das de vida, sin registrar prdida de peso despus de las 2semanas de vida. Despus de alimentarse, es posible que el beb regurgite una pequea cantidad de leche. Esto es normal. Frecuencia y duracin de la lactancia El amamantamiento frecuente la ayudar a producir ms leche y puede prevenir dolores en los pezones y las mamas extremadamente llenas (congestin mamaria). Alimente al beb cuando muestre signos de hambre o si siente la necesidad de reducir la congestin de las mamas. Esto se denomina "lactancia a demanda". Las seales de que el beb tiene hambre incluyen las siguientes:  Aumento del estado de alerta, actividad o inquietud.  Mueve la cabeza de un lado a otro.  Abre la boca cuando se le toca la mejilla o la comisura de la boca (reflejo de bsqueda).  Aumenta las vocalizaciones, tales como sonidos de succin, se relame los labios, emite arrullos, suspiros o chirridos.  Mueve la mano hacia la boca y se chupa  los dedos o las manos.  Est molesto o llora. Evite el uso del chupete en las primeras 4 a 6 semanas despus del nacimiento del beb. Despus de este perodo, podr usar un chupete. Las investigaciones demostraron que el uso del chupete durante el primer ao de vida del beb disminuye el riesgo de tener el sndrome de muerte sbita del lactante (SMSL). Permita que el nio se alimente en cada mama todo lo que desee. Cuando el beb se desprende o se queda dormido mientras se est alimentando de la primera mama, ofrzcale la segunda. Debido a que, con frecuencia, los recin nacidos estn somnolientos las primeras semanas de vida, es posible que deba despertar al beb para alimentarlo. Los horarios de lactancia varan de un beb a otro. Sin embargo, las siguientes reglas pueden servir como gua para ayudarla a garantizar que el beb se alimenta adecuadamente:  Se puede amamantar a los recin nacidos (bebs de 4 semanas o menos de vida) cada 1 a 3 horas.  No deben transcurrir ms de 3 horas durante el da o 5 horas durante la noche sin que se amamante a los recin nacidos.  Debe amamantar al beb un mnimo de 8 veces en un perodo de 24 horas. Extraccin de leche materna La extraccin y el almacenamiento de la leche materna le permiten asegurarse de que el beb se alimente exclusivamente de su leche materna, aun en momentos en los que no puede amamantar. Esto tiene especial importancia si debe regresar al trabajo en el perodo en que an est amamantando o si no puede estar presente en los momentos en que el beb debe alimentarse. Su asesor en lactancia puede ayudarla a encontrar un mtodo de extraccin que funcione mejor para usted y orientarla sobre cunto tiempo es seguro almacenar leche materna.      Cmo cuidar las mamas durante la lactancia Los pezones pueden secarse, agrietarse y doler durante la lactancia. Las siguientes recomendaciones pueden ayudarla a mantener las mamas humectadas y  sanas:  Evite usar jabn en los pezones.  Use un sostn de soporte diseado especialmente para la lactancia materna. Evite usar sostenes con aro o sostenes muy ajustados (sostenes deportivos).  Seque al aire sus pezones durante 3 a 4minutos despus de amamantar al beb.  Utilice solo apsitos de algodn en   el sostn para absorber las prdidas de leche. La prdida de un poco de leche materna entre las tomas es normal.  Utilice lanolina sobre los pezones luego de amamantar. La lanolina ayuda a mantener la humedad normal de la piel. La lanolina pura no es perjudicial (no es txica) para el beb. Adems, puede extraer manualmente algunas gotas de leche materna y masajear suavemente esa leche sobre los pezones para que la leche se seque al aire. Durante las primeras semanas despus del nacimiento, algunas mujeres experimentan congestin mamaria. La congestin mamaria puede hacer que sienta las mamas pesadas, calientes y sensibles al tacto. El pico de la congestin mamaria ocurre en el plazo de los 3 a 5 das despus del parto. Las siguientes recomendaciones pueden ayudarla a aliviar la congestin mamaria:  Vace por completo las mamas al amamantar o extraer leche. Puede aplicar calor hmedo en las mamas (en la ducha o con toallas hmedas para manos) antes de amamantar o extraer leche. Esto aumenta la circulacin y ayuda a que la leche fluya. Si el beb no vaca por completo las mamas cuando lo amamanta, extraiga la leche restante despus de que haya finalizado.  Aplique compresas de hielo sobre las mamas inmediatamente despus de amamantar o extraer leche, a menos que le resulte demasiado incmodo. Haga lo siguiente: ? Ponga el hielo en una bolsa plstica. ? Coloque una toalla entre la piel y la bolsa de hielo. ? Coloque el hielo durante 20minutos, 2 o 3veces por da.  Asegrese de que el beb est prendido y se encuentre en la posicin correcta mientras lo alimenta. Si la congestin mamaria  persiste luego de 48 horas o despus de seguir estas recomendaciones, comunquese con su mdico o un asesor en lactancia. Recomendaciones de salud general durante la lactancia  Consuma 3 comidas y 3 colaciones saludables todos los das. Las madres bien alimentadas que amamantan necesitan entre 450 y 500 caloras adicionales por da. Puede cumplir con este requisito al aumentar la cantidad de una dieta equilibrada que realice.  Beba suficiente agua para mantener la orina clara o de color amarillo plido.  Descanse con frecuencia, reljese y siga tomando sus vitaminas prenatales para prevenir la fatiga, el estrs y los niveles bajos de vitaminas y minerales en el cuerpo (deficiencias de nutrientes).  No consuma ningn producto que contenga nicotina o tabaco, como cigarrillos y cigarrillos electrnicos. El beb puede verse afectado por las sustancias qumicas de los cigarrillos que pasan a la leche materna y por la exposicin al humo ambiental del tabaco. Si necesita ayuda para dejar de fumar, consulte al mdico.  Evite el consumo de alcohol.  No consuma drogas ilegales o marihuana.  Antes de usar cualquier medicamento, hable con el mdico. Estos incluyen medicamentos recetados y de venta libre, como tambin vitaminas y suplementos a base de hierbas. Algunos medicamentos, que pueden ser perjudiciales para el beb, pueden pasar a travs de la leche materna.  Puede quedar embarazada durante la lactancia. Si se desea un mtodo anticonceptivo, consulte al mdico sobre cules son las opciones seguras durante la lactancia. Dnde encontrar ms informacin: Liga internacional La Leche: www.llli.org. Comunquese con un mdico si:  Siente que quiere dejar de amamantar o se siente frustrada con la lactancia.  Sus pezones estn agrietados o sangran.  Sus mamas estn irritadas, sensibles o calientes.  Tiene los siguientes sntomas: ? Dolor en las mamas o en los pezones. ? Un rea hinchada en cualquiera  de las mamas. ? Fiebre o escalofros. ? Nuseas o vmitos. ?   Drenaje de otro lquido distinto de la leche materna desde los pezones.  Sus mamas no se llenan antes de amamantar al beb para el quinto da despus del parto.  Se siente triste y deprimida.  El beb: ? Est demasiado somnoliento como para comer bien. ? Tiene problemas para dormir. ? Tiene ms de 1 semana de vida y moja menos de 6 paales en un periodo de 24 horas. ? No ha aumentado de peso a los 5 das de vida.  El beb defeca menos de 3 veces en 24 horas.  La piel del beb o las partes blancas de los ojos se vuelven amarillentas. Solicite ayuda de inmediato si:  El beb est muy cansado (letargo) y no se quiere despertar para comer.  Le sube la fiebre sin causa. Resumen  La lactancia materna ofrece muchos beneficios de salud para bebs y madres.  Intente amamantar a su beb cuando muestre signos tempranos de hambre.  Haga cosquillas o empuje suavemente los labios del beb con el dedo o el pezn para lograr que el beb abra la boca. Acerque el beb a la mama. Asegrese de que la mayor parte de la arola se encuentre dentro de la boca del beb. Ofrzcale una mama y haga eructar al beb antes de pasar a la otra.  Hable con su mdico o asesor en lactancia si tiene dudas o problemas con la lactancia. Esta informacin no tiene como fin reemplazar el consejo del mdico. Asegrese de hacerle al mdico cualquier pregunta que tenga. Document Revised: 05/09/2017 Document Reviewed: 06/04/2016 Elsevier Patient Education  2021 [employer] Inc.  

## 2020-04-08 NOTE — Progress Notes (Signed)
   Subjective:  Suzanne Estes is a 40 y.o. [phone] at [redacted]w[redacted]d being seen today for ongoing prenatal care.  She is currently monitored for the following issues for this high-risk pregnancy and has Acute appendicitis; Supervision of high risk pregnancy, antepartum; AMA (advanced maternal age) multigravida 35+; Language barrier; Choroid plexus cysts, fetal, affecting care of mother, antepartum; Family history of malignant neoplasm of gastrointestinal tract; and Gestational diabetes on their problem list.  Patient reports no complaints.  Contractions: Not present. Vag. Bleeding: None.  Movement: Present. Denies leaking of fluid.   The following portions of the patient's history were reviewed and updated as appropriate: allergies, current medications, past family history, past medical history, past social history, past surgical history and problem list. Problem list updated.  Objective:   Vitals:   04/08/20 1113  BP: 119/69  Pulse: 89  Weight: 150 lb 6.4 oz (68.2 kg)    Fetal Status: Fetal Heart Rate (bpm): 160   Movement: Present     General:  Alert, oriented and cooperative. Patient is in no acute distress.  Skin: Skin is warm and dry. No rash noted.   Cardiovascular: Normal heart rate noted  Respiratory: Normal respiratory effort, no problems with respiration noted  Abdomen: Soft, gravid, appropriate for gestational age. Pain/Pressure: Present     Pelvic: Vag. Bleeding: None     Cervical exam deferred        Extremities: Normal range of motion.  Edema: None  Mental Status: Normal mood and affect. Normal behavior. Normal judgment and thought content.     Urinalysis:      Assessment and Plan:  Pregnancy: [id number] at [redacted]w[redacted]d  1. Supervision of high risk pregnancy, antepartum BP and FHR normal [place] from [facility] normal on 12/30, CPC resolved Still waiting for results from [facility] for 1/24 [place]  2. Language barrier Spanish   3. Gestational diabetes mellitus (GDM),  antepartum, gestational diabetes method of control unspecified Post prandials large uncontrolled, fasting are within range Will start metformin 500mg  BID and have her return for glucose review in one week with DM educator, if remain elevated will increase to 1g BID, if at 2 wk ROB still elevated start insulin Will follow up growth results from 03/21/2020, may need one more To start weekly BPP with 03/23/2020, will given financial aid application  4. Multigravida of advanced maternal age in third trimester   Preterm labor symptoms and general obstetric precautions including but not limited to vaginal bleeding, contractions, leaking of fluid and fetal movement were reviewed in detail with the patient. Please refer to After Visit Summary for other counseling recommendations.  Return in 2 weeks (on 04/22/2020) for [facility], ob visit, needs MD.   [facility], MD

## 2020-04-11 ENCOUNTER — Other Ambulatory Visit: Payer: Self-pay

## 2020-04-14 ENCOUNTER — Other Ambulatory Visit: Payer: Self-pay

## 2020-04-14 ENCOUNTER — Ambulatory Visit (INDEPENDENT_AMBULATORY_CARE_PROVIDER_SITE_OTHER): Payer: Self-pay

## 2020-04-14 ENCOUNTER — Ambulatory Visit (INDEPENDENT_AMBULATORY_CARE_PROVIDER_SITE_OTHER): Payer: Self-pay | Admitting: General Practice

## 2020-04-14 VITALS — BP 109/66 | HR 87 | Wt 147.0 lb

## 2020-04-14 DIAGNOSIS — O0990 Supervision of high risk pregnancy, unspecified, unspecified trimester: Secondary | ICD-10-CM

## 2020-04-14 DIAGNOSIS — O09523 Supervision of elderly multigravida, third trimester: Secondary | ICD-10-CM

## 2020-04-14 DIAGNOSIS — O24415 Gestational diabetes mellitus in pregnancy, controlled by oral hypoglycemic drugs: Secondary | ICD-10-CM

## 2020-04-14 DIAGNOSIS — Z789 Other specified health status: Secondary | ICD-10-CM

## 2020-04-14 NOTE — Progress Notes (Signed)
Pt informed that the ultrasound is considered a limited OB ultrasound and is not intended to be a complete ultrasound exam.  Patient also informed that the ultrasound is not being completed with the intent of assessing for fetal or placental anomalies or any pelvic abnormalities.  Explained that the purpose of today's ultrasound is to assess for  BPP, presentation and AFI.  Patient acknowledges the purpose of the exam and the limitations of the study.    Chase Caller RN BSN 04/14/20

## 2020-04-21 ENCOUNTER — Encounter: Payer: Self-pay | Admitting: Registered"

## 2020-04-21 ENCOUNTER — Other Ambulatory Visit: Payer: Self-pay

## 2020-04-21 ENCOUNTER — Ambulatory Visit: Payer: Self-pay | Admitting: Registered"

## 2020-04-21 ENCOUNTER — Ambulatory Visit (INDEPENDENT_AMBULATORY_CARE_PROVIDER_SITE_OTHER): Payer: Self-pay

## 2020-04-21 ENCOUNTER — Ambulatory Visit (INDEPENDENT_AMBULATORY_CARE_PROVIDER_SITE_OTHER): Payer: Self-pay | Admitting: *Deleted

## 2020-04-21 VITALS — BP 119/69 | HR 82 | Wt 150.2 lb

## 2020-04-21 VITALS — Wt 150.2 lb

## 2020-04-21 DIAGNOSIS — O24415 Gestational diabetes mellitus in pregnancy, controlled by oral hypoglycemic drugs: Secondary | ICD-10-CM

## 2020-04-21 DIAGNOSIS — O09523 Supervision of elderly multigravida, third trimester: Secondary | ICD-10-CM

## 2020-04-21 DIAGNOSIS — O24419 Gestational diabetes mellitus in pregnancy, unspecified control: Secondary | ICD-10-CM

## 2020-04-21 NOTE — Progress Notes (Signed)
Video Interpreter services provided by Collier Bullock [phone] from [employer]  Patient was seen on 04/21/20 for follow-up assessment and education for Gestational Diabetes. EDD 05/28/20; [redacted]w[redacted]d. Patient states changes to diet/lifestyle including less rice   Patient is testing blood glucose as directed pre breakfast and 2 hours after each meal.    Wt Readings from Last 3 Encounters:  04/21/20 150 lb 3.2 oz (68.1 kg)  04/14/20 147 lb (66.7 kg)  04/08/20 150 lb 6.4 oz (68.2 kg)   The following learning objectives reviewed during follow-up visit:  Postpartum care and prevention of T2DM  Plan:  . Continue eating balanced meals and taking medication as directed   Patient instructed to monitor glucose levels: FBS: 60 - 95 mg/dl 2 hour: <045 mg/dl  Patient received the following handouts:    Patient will be seen for follow-up in as needed.

## 2020-04-25 ENCOUNTER — Other Ambulatory Visit: Payer: Self-pay

## 2020-04-25 ENCOUNTER — Encounter: Payer: Self-pay | Admitting: Obstetrics and Gynecology

## 2020-04-25 ENCOUNTER — Ambulatory Visit (INDEPENDENT_AMBULATORY_CARE_PROVIDER_SITE_OTHER): Payer: Self-pay | Admitting: Student

## 2020-04-25 ENCOUNTER — Encounter: Payer: Self-pay | Admitting: Student

## 2020-04-25 VITALS — BP 116/74 | HR 88 | Wt 151.4 lb

## 2020-04-25 DIAGNOSIS — O09523 Supervision of elderly multigravida, third trimester: Secondary | ICD-10-CM

## 2020-04-25 DIAGNOSIS — O322XX0 Maternal care for transverse and oblique lie, not applicable or unspecified: Secondary | ICD-10-CM

## 2020-04-25 DIAGNOSIS — O0990 Supervision of high risk pregnancy, unspecified, unspecified trimester: Secondary | ICD-10-CM

## 2020-04-25 DIAGNOSIS — Z3A35 35 weeks gestation of pregnancy: Secondary | ICD-10-CM

## 2020-04-25 DIAGNOSIS — Z789 Other specified health status: Secondary | ICD-10-CM

## 2020-04-25 DIAGNOSIS — O24415 Gestational diabetes mellitus in pregnancy, controlled by oral hypoglycemic drugs: Secondary | ICD-10-CM

## 2020-04-25 LAB — POCT URINALYSIS DIP (DEVICE)
Bilirubin Urine: NEGATIVE
Glucose, UA: NEGATIVE mg/dL
Hgb urine dipstick: NEGATIVE
Ketones, ur: NEGATIVE mg/dL
Leukocytes,Ua: NEGATIVE
Nitrite: NEGATIVE
Protein, ur: NEGATIVE mg/dL
Specific Gravity, Urine: 1.01 (ref 1.005–1.030)
Urobilinogen, UA: 0.2 mg/dL (ref 0.0–1.0)
pH: 6.5 (ref 5.0–8.0)

## 2020-04-25 MED ORDER — METFORMIN HCL 500 MG PO TABS: ORAL_TABLET | ORAL | 1 refills | Status: DC

## 2020-04-25 NOTE — Progress Notes (Signed)
   PRENATAL VISIT NOTE  Subjective:  ALLETA Estes is a 40 y.o. [phone] at [redacted]w[redacted]d being seen today for ongoing prenatal care.  She is currently monitored for the following issues for this high-risk pregnancy and has Acute appendicitis; Supervision of high risk pregnancy, antepartum; AMA (advanced maternal age) multigravida 35+; Language barrier; Choroid plexus cysts, fetal, affecting care of mother, antepartum; Family history of malignant neoplasm of gastrointestinal tract; and Gestational diabetes on their problem list.  Patient reports heartburn.  Contractions: Irritability. Vag. Bleeding: None.  Movement: Present. Denies leaking of fluid.   The following portions of the patient's history were reviewed and updated as appropriate: allergies, current medications, past family history, past medical history, past social history, past surgical history and problem list.   Objective:   Vitals:   04/25/20 1106  BP: 116/74  Pulse: 88  Weight: 151 lb 6.4 oz (68.7 kg)    Fetal Status: Fetal Heart Rate (bpm): 150 Fundal Height: 35 cm Movement: Present     General:  Alert, oriented and cooperative. Patient is in no acute distress.  Skin: Skin is warm and dry. No rash noted.   Cardiovascular: Normal heart rate noted  Respiratory: Normal respiratory effort, no problems with respiration noted  Abdomen: Soft, gravid, appropriate for gestational age.  Pain/Pressure: Present     Pelvic: Cervical exam deferred        Extremities: Normal range of motion.  Edema: None  Mental Status: Normal mood and affect. Normal behavior. Normal judgment and thought content.   Assessment and Plan:  Pregnancy: [id number] at [redacted]w[redacted]d 1. Supervision of high risk pregnancy, antepartum   2. Gestational diabetes mellitus (GDM) in third trimester controlled on oral hypoglycemic drug -reviewed CBGs with Dr. Adrian [facility]. Will increase metformin to 1000 mg in AM & 500 mg in PM Fasting PPB PPL PPD  75-91, 0/17 abnl 87-128,  3/16 abnl 88-154, 4/16 abnl 93-124, 2/15 abnl    -reviewed [facility] ultrasound from 1/24 -yet to be scanned to media. EFW at 63rd % & AFI 12.  -36 wk growth ultrasound scheduled at [facility] for 3/10 -weekly BPPs being done & reviewed  3. Transverse presentation, antepartum, single or unspecified fetus -per ultrasound on 2/24, transverse. Will have next appointment in 1 week after her next BPP. If still transverse will see MD to discuss delivery options vs ECV  4. Multigravida of advanced maternal age in third trimester   5. Language barrier -Spanish interpreter in person during this encounter   Term labor symptoms and general obstetric precautions including but not limited to vaginal bleeding, contractions, leaking of fluid and fetal movement were reviewed in detail with the patient. Please refer to After Visit Summary for other counseling recommendations.   Return in about 1 week (around 05/02/2020) for High Risk OB, in person with MD.  Future Appointments  Date Time Provider Department Center  04/28/2020 10:15 AM [facility]  05/05/2020  9:20 AM Allyn Bing, MD [facility]  05/05/2020 10:15 AM [facility]  05/12/2020  9:15 AM Adam Phenix, MD [facility]  05/12/2020 10:15 AM [facility]    Judeth Horn, NP

## 2020-04-25 NOTE — Patient Instructions (Addendum)
Ultrasound: May 05, 2020 at 10 AM Location: [facility] Department    Diabetes mellitus gestacional, cuidados personales Gestational Diabetes Mellitus, Self-Care Las mujeres que tienen diabetes mellitus gestacional deben mantener su nivel de azcar en la sangre (glucosa) dentro de lmites saludables. Cules son los riesgos? Si no recibe tratamiento, esta afeccin puede causar problemas a usted y a su beb en gestacin. Para la mam  Dar a luz al beb de manera temprana.  Tener problemas durante el [place] de parto y al dar a Patent examiner.  Necesitar una ciruga para dar a luz al beb (parto por cesrea).  Tener problemas con la presin arterial.  Tener esta forma de diabetes de nuevo al estar embarazada.  Desarrollar diabetes tipo2 en el futuro. Para el beb  [employer] de [place].  Tamao corporal ms grande que lo normal.  Problemas respiratorios. [facility] Ambulance person su nivel de azcar en la sangre todos los das durante el [place]. Contrlelo con la frecuencia que le haya indicado el mdico. Para hacer esto: 1. Lvese las manos con agua y [place] durante al menos 20segundos. 2. Pnchese el costado del dedo (no en la punta) con la lanceta. Use un dedo diferente cada vez. 3. Frote suavemente el dedo hasta que aparezca una pequea gota de [place]. 4. Siga las instrucciones que vienen con el medidor para: ? Patent attorney. ? Poner sangre en la tira reactiva. ? Obtener el resultado. 5. Registre su resultado y las observaciones que desee. En general, sus niveles de azcar en la sangre deben ser los siguientes:  95 mg/dl (5.3 mmol/l) si no ha comido.  140 mg/dl (7.8 mmol/l) 1 hora despus de una comida.  120 mg/dl (6.7 mmol/l) 2 horas despus de una comida.   Siga estas instrucciones en su casa: Medicamentos  Use los medicamentos de venta libre y los recetados solamente como se lo haya indicado el mdico.  Si el  mdico le recet insulina u otros medicamentos para la diabetes, aplquesela o tmelos todos los das: ? Aplquese o tome los medicamentos todos [place]. ? No se quede sin insulina o sin otros medicamentos. Planifique con antelacin para tenerlos siempre. Comida y bebida  Siga las instrucciones del mdico respecto de las restricciones en las comidas o las bebidas.  Consulte a un experto en alimentacin (nutricionista) que le ayude a crear un plan de alimentacin para Psychologist, clinical. Los alimentos de este plan deben incluir lo siguiente: ? Protenas con bajo contenido de [place]. ? Frijoles secos, frutos secos, y panes, cereales o pasta integrales. ? Nils Pyle y verduras frescas. ? Productos lcteos con bajo contenido de grasa. ? Grasas saludables.  Ingiera refrigerios saludables entre comidas nutritivas.  Beba suficiente lquido para Radio producer pis (orina) de color amarillo plido.  Lleve un registro de los hidratos de carbono que consume. Para hacer esto: ? Lea las etiquetas de los alimentos. ? Aprenda cules son los tamaos de las porciones de los alimentos.  Siga su plan para los das de enfermedad cuando no pueda comer ni beber normalmente. [employer] plan con el mdico, de modo que est listo para usarlo.   Actividad  [address] [employer] ejercicios como se lo haya indicado el mdico.  Haga actividad fsica durante o ms por da, o durante el tiempo que el [employer] Depot recomiende. Puede ayudar a Medical sales representative de azcar en la sangre despus de una comida si: ? Hace 10  minutos de actividad fsica despus de cada comida. ? Comienza esa actividad fsica 30 minutos despus de la comida.  Hable con el mdico antes de comenzar una rutina de ejercicio nueva. Es posible que el mdico le diga que haga cambios en la [place], otros medicamentos o los alimentos. Estilo de vida  No beba alcohol.  No consuma ningn producto que contenga nicotina o tabaco, como  cigarrillos, cigarrillos electrnicos y tabaco de Theatre manager. Si necesita ayuda para dejar de consumir estos productos, consulte al mdico.  Aprenda cmo sobrellevar el estrs. Si necesita ayuda para lograrlo, consulte al American Express. Cuidado del cuerpo  Mantenga las vacunas al da.  [employer]. Para hacer esto: ? Cepllese los dientes y las [address] veces al da. ? Psese hilo dental una o ms veces por da. ? Vaya al dentista una vez cada o con ms frecuencia.  Mantenga un peso Office manager. Indicaciones generales  Pregntele al Enterprise Products riesgos de la hipertensin arterial en el embarazo.  Comparta su plan de atencin de la diabetes con: ? Sus compaeros de trabajo o de la escuela. ? [employer] Corporation con las que [place].  Hgase pruebas de orina para Landscape architect presencia de cetonas: ? Cuando est enferma. ? Como se lo haya indicado el mdico.  Lleve consigo una tarjeta, o use un brazalete que diga que tiene diabetes.  Cumpla con todas las visitas de seguimiento. Cuidados despus del parto  Hgase controlar el nivel de azcar en la sangre 4 a 12semanas despus del parto.  Hgase controlar si tiene diabetes una o ms veces cada 3 aos o segn le hayan indicado. Dnde buscar ms informacin  American Diabetes Association (ADA) (Asociacin Estadounidense de la Diabetes): diabetes.org  Association of Diabetes Care & Education Specialists (ADCES) (Asociacin de Especialistas en Atencin y Educacin sobre la Diabetes): diabeteseducator.org  Centers for Disease Control and Prevention (Centros para el Control y la Prevencin de [place], CDC): TonerPromos.no  American Pregnancy Association (Asociacin Americana del [place]): americanpregnancy.org  U.S. Department of Therapist, sports (MyPlate del Departamento de Agricultura de los EE.UU.): WrestlingReporter.dk Comunquese con un mdico si:  Su azcar en la sangre est por encima de su valor ideal  en dospruebas consecutivas.  Tiene fiebre.  Ha estado enferma durante 2 o ms das y no mejora.  Tiene cualquiera de estos problemas durante ms de 6horas: ? Vomita cada vez que come o bebe. ? Presenta heces lquidas (diarrea). Solicite ayuda de inmediato si:  No puede pensar con claridad.  Tiene dificultad para respirar.  Tiene un nivel moderado o alto de cetonas en la [place].  Le comienza a salir lquido anmalo o sangre de la vagina.  Siente que el beb no se mueve tanto como es habitual.  Comienza a tener contracciones de [place] temprana. Siente que el vientre se endurece.  Tiene un dolor de cabeza muy intenso. Estos sntomas pueden Customer service manager. Solicite ayuda de inmediato. Comunquese con el servicio de emergencias de su localidad (911 en los [place]).  No espere a ver si los sntomas desaparecen.  No conduzca por sus propios medios [employer] General hospital. Resumen  Controle su azcar en la sangre (glucosa) mientras est embarazada. Contrlelo con la frecuencia que le haya indicado el mdico.  Aplquese la insulina y tome los medicamentos para la diabetes como se lo hayan indicado.  Hgase controlar el nivel de azcar en la sangre 4 a 12semanas despus del parto.  Cumpla con todas las visitas de  seguimiento. Esta informacin no tiene Theme park manager el consejo del mdico. Asegrese de hacerle al mdico cualquier pregunta que tenga. Document Revised: 09/15/2019 Document Reviewed: 09/15/2019 Elsevier Patient Education  2021 Elsevier Inc.    The Maternity Assessment Unit (MAU) is located at the [facility] [place] [facility] at [facility] The address is: [address], [place], [place], [place] [address]. Please see map below for additional directions.    The Maternity Assessment Unit is designed to help you during your pregnancy, and for up to 6 weeks after delivery, with any pregnancy- or postpartum-related emergencies, if you  think you are in labor, or if your water has broken. For example, if you experience nausea and vomiting, vaginal bleeding, severe abdominal or pelvic pain, elevated blood pressure or other problems related to your pregnancy or postpartum time, please come to the Maternity Assessment Unit for assistance.

## 2020-04-25 NOTE — Progress Notes (Signed)
Follow up growth Korea and BPP scheduled with [facility] for 05/05/20 at 1000 and pt notified. Pt will be seen in office same day for provider visit with [facility] only.  Fleet Contras RN 04/25/20

## 2020-04-28 ENCOUNTER — Ambulatory Visit (INDEPENDENT_AMBULATORY_CARE_PROVIDER_SITE_OTHER): Payer: Self-pay | Admitting: *Deleted

## 2020-04-28 ENCOUNTER — Ambulatory Visit (INDEPENDENT_AMBULATORY_CARE_PROVIDER_SITE_OTHER): Payer: Self-pay

## 2020-04-28 ENCOUNTER — Other Ambulatory Visit: Payer: Self-pay

## 2020-04-28 VITALS — BP 123/70 | HR 88 | Wt 150.5 lb

## 2020-04-28 DIAGNOSIS — O09523 Supervision of elderly multigravida, third trimester: Secondary | ICD-10-CM

## 2020-04-28 DIAGNOSIS — O24415 Gestational diabetes mellitus in pregnancy, controlled by oral hypoglycemic drugs: Secondary | ICD-10-CM

## 2020-04-28 NOTE — Progress Notes (Signed)
Patient was assessed and managed by nursing staff during this encounter. I have reviewed the chart and agree with the documentation and plan. I have also made any necessary editorial changes.  Warden Fillers, MD 04/28/2020 4:53 PM

## 2020-04-28 NOTE — Progress Notes (Signed)
[name] present for encounter. PT was escorted tot he Stage manager following her visit today.   Pt informed that the ultrasound is considered a limited OB ultrasound and is not intended to be a complete ultrasound exam.  Patient also informed that the ultrasound is not being completed with the intent of assessing for fetal or placental anomalies or any pelvic abnormalities.  Explained that the purpose of today's ultrasound is to assess for presentation, BPP and amniotic fluid volume.  Patient acknowledges the purpose of the exam and the limitations of the study.

## 2020-05-04 ENCOUNTER — Encounter: Payer: Self-pay | Admitting: *Deleted

## 2020-05-05 ENCOUNTER — Telehealth: Payer: Self-pay

## 2020-05-05 ENCOUNTER — Other Ambulatory Visit (HOSPITAL_COMMUNITY)
Admission: RE | Admit: 2020-05-05 | Discharge: 2020-05-05 | Disposition: A | Discharge status: Home / Self Care | Payer: Self-pay | Source: Ambulatory Visit | Attending: Obstetrics and Gynecology | Admitting: Obstetrics and Gynecology

## 2020-05-05 ENCOUNTER — Encounter: Payer: Self-pay | Admitting: Obstetrics and Gynecology

## 2020-05-05 ENCOUNTER — Other Ambulatory Visit: Payer: Self-pay

## 2020-05-05 ENCOUNTER — Ambulatory Visit (INDEPENDENT_AMBULATORY_CARE_PROVIDER_SITE_OTHER): Payer: Self-pay | Admitting: Obstetrics and Gynecology

## 2020-05-05 VITALS — BP 115/68 | HR 80 | Wt 150.3 lb

## 2020-05-05 DIAGNOSIS — O0990 Supervision of high risk pregnancy, unspecified, unspecified trimester: Secondary | ICD-10-CM | POA: Insufficient documentation

## 2020-05-05 DIAGNOSIS — O320XX0 Maternal care for unstable lie, not applicable or unspecified: Secondary | ICD-10-CM | POA: Insufficient documentation

## 2020-05-05 DIAGNOSIS — Z789 Other specified health status: Secondary | ICD-10-CM

## 2020-05-05 DIAGNOSIS — O24415 Gestational diabetes mellitus in pregnancy, controlled by oral hypoglycemic drugs: Secondary | ICD-10-CM

## 2020-05-05 DIAGNOSIS — O09523 Supervision of elderly multigravida, third trimester: Secondary | ICD-10-CM

## 2020-05-05 DIAGNOSIS — Z3A36 36 weeks gestation of pregnancy: Secondary | ICD-10-CM

## 2020-05-05 NOTE — Progress Notes (Signed)
Prenatal Visit Note Date: 05/05/2020 Clinic: [facility]  Subjective:  Suzanne Estes is a 40 y.o. [phone] at [redacted]w[redacted]d being seen today for ongoing prenatal care.  She is currently monitored for the following issues for this high-risk pregnancy and has Supervision of high risk pregnancy, antepartum; AMA (advanced maternal age) multigravida 35+; Language barrier; Family history of malignant neoplasm of gastrointestinal tract; Gestational diabetes; and Malpresentation before onset of labor on their problem list.  Patient reports no complaints.   Contractions: Not present. Vag. Bleeding: None.  Movement: Present. Denies leaking of fluid.   The following portions of the patient's history were reviewed and updated as appropriate: allergies, current medications, past family history, past medical history, past social history, past surgical history and problem list. Problem list updated.  Objective:   Vitals:   05/05/20 0838  BP: 115/68  Pulse: 80  Weight: 150 lb 4.8 oz (68.2 kg)    Fetal Status: Fetal Heart Rate (bpm): 141 Fundal Height: 35 cm Movement: Present  Presentation: Vertex  General:  Alert, oriented and cooperative. Patient is in no acute distress.  Skin: Skin is warm and dry. No rash noted.   Cardiovascular: Normal heart rate noted  Respiratory: Normal respiratory effort, no problems with respiration noted  Abdomen: Soft, gravid, appropriate for gestational age. Pain/Pressure: Present     Pelvic:  Cervical exam deferred        Extremities: Normal range of motion.  Edema: None  Mental Status: Normal mood and affect. Normal behavior. Normal judgment and thought content.   Urinalysis:      Assessment and Plan:  Pregnancy: [id number] at [redacted]w[redacted]d  1. Supervision of high risk pregnancy, antepartum Routine care - GC/Chlamydia probe amp ([place])not at [facility] - Culture, beta strep (group b only)  2. [redacted] weeks gestation of pregnancy  3. Malpresentation  before onset of labor, single or unspecified fetus Cephalic on u/s today  4. Gestational diabetes mellitus (GDM) in third trimester controlled on oral hypoglycemic drug On metformin 1000 and 500 with dinner. Normal CBG with a few just above 120 for breakfast and dinner post prandials. Watch diet and recommend post prandial walk  1/24: afi 12, 63%, 1709gm, no cp cyst  Has bpp/nst today here later today and growth at [facility] today  IOL to be set up for 3/26 or 3/27  5. Multigravida of advanced maternal age in third trimester  6. Language barrier Interpreter used  Preterm labor symptoms and general obstetric precautions including but not limited to vaginal bleeding, contractions, leaking of fluid and fetal movement were reviewed in detail with the patient. Please refer to After Visit Summary for other counseling recommendations.  Return in about 1 week (around 05/12/2020) for in person, md visit, nst/bpp with diane.   [place] Bing, MD

## 2020-05-05 NOTE — Telephone Encounter (Signed)
Called PT using Spanish [address] Alfonzo Beers ID# [phone] to advise Pt of Induction scheduled for 05/22/20 in the AM, advised L&D will be calling her with the exact time to come in. Pt verbalized understanding.

## 2020-05-05 NOTE — Progress Notes (Signed)
Pt has Paper Log for Glucose levels.

## 2020-05-06 LAB — GC/CHLAMYDIA PROBE AMP (~~LOC~~) NOT AT ARMC
Chlamydia: NEGATIVE
Comment: NEGATIVE
Comment: NORMAL
Neisseria Gonorrhea: NEGATIVE

## 2020-05-08 LAB — CULTURE, BETA STREP (GROUP B ONLY): Strep Gp B Culture: NEGATIVE

## 2020-05-10 ENCOUNTER — Other Ambulatory Visit: Payer: Self-pay | Admitting: Advanced Practice Midwife

## 2020-05-12 ENCOUNTER — Other Ambulatory Visit: Payer: Self-pay

## 2020-05-12 ENCOUNTER — Ambulatory Visit (INDEPENDENT_AMBULATORY_CARE_PROVIDER_SITE_OTHER): Payer: Self-pay

## 2020-05-12 ENCOUNTER — Ambulatory Visit (INDEPENDENT_AMBULATORY_CARE_PROVIDER_SITE_OTHER): Payer: Self-pay | Admitting: *Deleted

## 2020-05-12 ENCOUNTER — Ambulatory Visit (INDEPENDENT_AMBULATORY_CARE_PROVIDER_SITE_OTHER): Payer: Self-pay | Admitting: Obstetrics & Gynecology

## 2020-05-12 VITALS — BP 124/78 | HR 90 | Wt 150.0 lb

## 2020-05-12 DIAGNOSIS — O24415 Gestational diabetes mellitus in pregnancy, controlled by oral hypoglycemic drugs: Secondary | ICD-10-CM

## 2020-05-12 DIAGNOSIS — Z758 Other problems related to medical facilities and other health care: Secondary | ICD-10-CM

## 2020-05-12 DIAGNOSIS — O0990 Supervision of high risk pregnancy, unspecified, unspecified trimester: Secondary | ICD-10-CM

## 2020-05-12 DIAGNOSIS — O09523 Supervision of elderly multigravida, third trimester: Secondary | ICD-10-CM

## 2020-05-12 DIAGNOSIS — Z789 Other specified health status: Secondary | ICD-10-CM

## 2020-05-12 LAB — POCT URINALYSIS DIP (DEVICE)
Bilirubin Urine: NEGATIVE
Glucose, UA: NEGATIVE mg/dL
Hgb urine dipstick: NEGATIVE
Ketones, ur: NEGATIVE mg/dL
Nitrite: NEGATIVE
Protein, ur: NEGATIVE mg/dL
Specific Gravity, Urine: 1.01 (ref 1.005–1.030)
Urobilinogen, UA: 0.2 mg/dL (ref 0.0–1.0)
pH: 5.5 (ref 5.0–8.0)

## 2020-05-12 NOTE — Patient Instructions (Signed)
Induccin del trabajo de parto Labor Induction La induccin del trabajo de parto hace referencia a las acciones que se inician para Radio producer que una mujer embarazada comience el [place] de [place]. La [employer] de las mujeres comienzan el trabajo de parto de forma natural entre las semanas 37 y 42 del Psychiatrist. Cuando esto no ocurre o cuando, por necesidad [place], se debe iniciar el [place] de [place], se pueden seguir otros pasos para inducir el trabajo de [place]. La induccin del trabajo de parto hace que el tero se contraiga. Tambin hace que el cuello uterino se ablande (madure), se abra (se dilate) y se afine. Generalmente, el trabajo de parto no se induce antes de [address], excepto que haya un motivo mdico para Media planner. Cundo se considera la induccin del [place] de Trail? La induccin del [place] de parto puede ser [place] para usted si:  Su embarazo dura ms de 41 a 42 semanas.  Se le est separando la placenta del tero (desprendimiento de la placenta).  Rompe la bolsa de aguas y [place] de parto no comienza.  Tiene problemas de [place], como diabetes o presin arterial alta (preeclampsia) durante el embarazo.  Su beb ha dejado de crecer o no tiene suficiente lquido Teacher, music. Antes de inducir el trabajo de [place], el mdico tendr en cuenta los siguientes factores:  Su estado clnico y [place] del beb.  Cuntas semanas tiene de [place].  La madurez de los pulmones del beb.  El estado del cuello uterino.  La posicin del beb.  El tamao del canal del parto. Informe al mdico acerca de lo siguiente:  Cualquier alergia que tenga.  Todos los [employer] [place], incluidos vitaminas, hierbas, gotas oftlmicas, cremas y [address] de [address].  Problemas previos que usted o sus familiares hayan tenido con los anestsicos.  Cirugas a las que se haya sometido.  Cualquier trastorno de la sangre que tenga.  Cualquier afeccin mdica que tenga. Cules son los  riesgos? En general, se trata de un procedimiento seguro. Sin embargo, pueden ocurrir complicaciones, por ejemplo:  Fracaso de la induccin.  Cambios en la frecuencia cardaca fetal, por ejemplo, los latidos son demasiado rpidos o lentos, o son irregulares (errticos).  Infeccin en la madre o el beb.  Aumento de la posibilidad de que sea necesaria una cesrea.  Ruptura (desprendimiento) de la placenta del tero. Esto es poco frecuente.  Ruptura del tero. Esto ocurre en muy contadas ocasiones.  El beb podra no recibir suficiente irrigacin sangunea u oxgeno. Esto puede ser potencialmente mortal. Cuando es necesario realizar una induccin por motivos mdicos, los beneficios suelen [employer]. Qu ocurre durante el procedimiento? Durante el procedimiento, el mdico utilizar uno de estos mtodos para inducir el [place] de parto:  Ruptura de las [place]. En este mtodo, se separa, con cuidado, el tejido del saco amnitico del cuello uterino. Esto provoca que suceda lo siguiente: ? El cuello uterino se estira, lo que, a su vez, provoca la liberacin de prostaglandinas. ? Las prostaglandinas inducen el [place] de parto y [place] que el tero se Technical sales engineer. ? Con frecuencia, este procedimiento se realiza durante una visita en el consultorio mdico. Le indicarn que vuelva a su casa y espere que se inicien las contracciones.  Administracin de prostaglandina. Este medicamento hace que se inicien las contracciones y que el cuello uterino se dilate y [place]. Puede tomarse por la boca (por va oral) o introducirse en la vagina (supositorio).  Insercin de un pequeo tubo delgado (catter) que  tiene un baln en el extremo en la vagina; luego, expansin del baln con agua para dilatar el cuello uterino.  Romper la bolsa de las aguas. En este mtodo, se [place] un instrumento pequeo para hacer un pequeo orificio en el saco amnitico. Al cabo de un tiempo, esto har que el saco amnitico  se rompa. Las contracciones deberan comenzar algunas horas despus.  Medicamentos que desencadenen o intensifiquen las contracciones. Estos se administran a travs de una va intravenosa (IV) que se coloca en una vena del brazo. Este procedimiento puede variar segn el mdico y el hospital.   Dnde buscar ms informacin  March of Dimes: www.marchofdimes.org  [employer] of Obstetricians and Gynecologists (Colegio Estadounidense de Obstetras y Gineclogos): www.acog.org Resumen  La induccin del trabajo de parto hace que el tero se contraiga. Tambin hace que el cuello uterino se ablande (madure), se abra (se dilate) y se afine.  Generalmente, el trabajo de [place] no se induce antes de las 39semanas de [place], excepto que haya un motivo mdico para Media planner.  Cuando es Passenger transport manager una induccin por motivos mdicos, los beneficios suelen [employer].  Hable con su mdico sobre qu mtodos de induccin del [place] de parto son adecuados para usted. Esta informacin no tiene Theme park manager el consejo del mdico. Asegrese de hacerle al mdico cualquier pregunta que tenga. Document Revised: 12/25/2019 Document Reviewed: 12/25/2019 Elsevier Patient Education  2021 [employer].

## 2020-05-12 NOTE — Progress Notes (Signed)
   PRENATAL VISIT NOTE  Subjective:  Suzanne Estes is a 40 y.o. [phone] at [redacted]w[redacted]d being seen today for ongoing prenatal care.  She is currently monitored for the following issues for this high-risk pregnancy and has Supervision of high risk pregnancy, antepartum; AMA (advanced maternal age) multigravida 35+; Language barrier; Family history of malignant neoplasm of gastrointestinal tract; Gestational diabetes; and Unstable lie of fetus on their problem list.  Patient reports occasional contractions.  Contractions: Irritability. Vag. Bleeding: None.  Movement: Present. Denies leaking of fluid.   The following portions of the patient's history were reviewed and updated as appropriate: allergies, current medications, past family history, past medical history, past social history, past surgical history and problem list.   Objective:   Vitals:   05/12/20 0931  BP: 124/78  Pulse: 90  Weight: 150 lb (68 kg)    Fetal Status: Fetal Heart Rate (bpm): 142 Fundal Height: 36 cm Movement: Present     General:  Alert, oriented and cooperative. Patient is in no acute distress.  Skin: Skin is warm and dry. No rash noted.   Cardiovascular: Normal heart rate noted  Respiratory: Normal respiratory effort, no problems with respiration noted  Abdomen: Soft, gravid, appropriate for gestational age.  Pain/Pressure: Present     Pelvic: Cervical exam deferred        Extremities: Normal range of motion.  Edema: None  Mental Status: Normal mood and affect. Normal behavior. Normal judgment and thought content.   Assessment and Plan:  Pregnancy: [id number] at [redacted]w[redacted]d 1. Supervision of high risk pregnancy, antepartum BPP today  2. Gestational diabetes mellitus (GDM) in third trimester controlled on oral hypoglycemic drug 90% BG in range on metformin  3. Language barrier Spanish interpreter  Term labor symptoms and general obstetric precautions including but not limited to vaginal bleeding,  contractions, leaking of fluid and fetal movement were reviewed in detail with the patient. Please refer to After Visit Summary for other counseling recommendations.   Return in about 1 week (around 05/19/2020).  Future Appointments  Date Time Provider Department Center  05/12/2020 10:15 AM [facility] NST [facility]  05/20/2020  9:45 AM [facility]-SCREENING [facility]-SDSC None  05/22/2020  6:30 AM MC-LD SCHED ROOM MC-INDC None    Scheryl Darter, MD

## 2020-05-17 ENCOUNTER — Telehealth (HOSPITAL_COMMUNITY): Payer: Self-pay | Admitting: *Deleted

## 2020-05-17 ENCOUNTER — Encounter (HOSPITAL_COMMUNITY): Payer: Self-pay | Admitting: *Deleted

## 2020-05-17 NOTE — Telephone Encounter (Signed)
Preadmission screen Interpreter number [phone]

## 2020-05-19 ENCOUNTER — Other Ambulatory Visit: Payer: Self-pay

## 2020-05-19 ENCOUNTER — Ambulatory Visit (INDEPENDENT_AMBULATORY_CARE_PROVIDER_SITE_OTHER): Payer: Self-pay

## 2020-05-19 ENCOUNTER — Encounter: Payer: Self-pay | Admitting: Obstetrics and Gynecology

## 2020-05-19 ENCOUNTER — Ambulatory Visit (INDEPENDENT_AMBULATORY_CARE_PROVIDER_SITE_OTHER): Payer: Self-pay | Admitting: Obstetrics and Gynecology

## 2020-05-19 ENCOUNTER — Ambulatory Visit (INDEPENDENT_AMBULATORY_CARE_PROVIDER_SITE_OTHER): Payer: Self-pay | Admitting: *Deleted

## 2020-05-19 VITALS — BP 132/72 | HR 85 | Wt 151.2 lb

## 2020-05-19 DIAGNOSIS — O09523 Supervision of elderly multigravida, third trimester: Secondary | ICD-10-CM

## 2020-05-19 DIAGNOSIS — O0990 Supervision of high risk pregnancy, unspecified, unspecified trimester: Secondary | ICD-10-CM

## 2020-05-19 DIAGNOSIS — O24415 Gestational diabetes mellitus in pregnancy, controlled by oral hypoglycemic drugs: Secondary | ICD-10-CM

## 2020-05-19 NOTE — Progress Notes (Signed)
Subjective:  Suzanne Estes is a 40 y.o. [phone] at [redacted]w[redacted]d being seen today for ongoing prenatal care.  She is currently monitored for the following issues for this high-risk pregnancy and has Supervision of high risk pregnancy, antepartum; AMA (advanced maternal age) multigravida 35+; Language barrier; Family history of malignant neoplasm of gastrointestinal tract; Gestational diabetes; and Unstable lie of fetus on their problem list.  Patient reports general discomforts of pregnancy.  Contractions: Irregular. Vag. Bleeding: None.  Movement: (!) Decreased. Denies leaking of fluid.   The following portions of the patient's history were reviewed and updated as appropriate: allergies, current medications, past family history, past medical history, past social history, past surgical history and problem list. Problem list updated.  Objective:   Vitals:   05/19/20 0845  BP: 132/72  Pulse: 85  Weight: 151 lb 3.2 oz (68.6 kg)    Fetal Status: Fetal Heart Rate (bpm): NST   Movement: (!) Decreased     General:  Alert, oriented and cooperative. Patient is in no acute distress.  Skin: Skin is warm and dry. No rash noted.   Cardiovascular: Normal heart rate noted  Respiratory: Normal respiratory effort, no problems with respiration noted  Abdomen: Soft, gravid, appropriate for gestational age. Pain/Pressure: Present     Pelvic:  Cervical exam deferred        Extremities: Normal range of motion.  Edema: None  Mental Status: Normal mood and affect. Normal behavior. Normal judgment and thought content.   Urinalysis:      Assessment and Plan:  Pregnancy: [id number] at [redacted]w[redacted]d  1. Supervision of high risk pregnancy, antepartum Stable IOL on 05/22/20  2. Gestational diabetes mellitus (GDM) in third trimester controlled on oral hypoglycemic drug CBG's in goal range BPP/NST today IOL as above  3. Multigravida of advanced maternal age in third trimester Stable  Term labor symptoms and  general obstetric precautions including but not limited to vaginal bleeding, contractions, leaking of fluid and fetal movement were reviewed in detail with the patient. Please refer to After Visit Summary for other counseling recommendations.  Return for IOL on 3/27, PP visit in 4 weeks from IOL.   Hermina Staggers, MD

## 2020-05-19 NOTE — Patient Instructions (Signed)
Parto vaginal Vaginal Delivery  Parto vaginal significa que usted da a luz empujando al beb fuera del canal del parto (vagina). Un equipo de proveedores de atencin mdica la ayudar antes, durante y despus del parto vaginal. Las experiencias de los nacimientos son nicas para todas las mujeres, y cada embarazo y las experiencias de nacimiento varan segn dnde elija dar a luz. Qu ocurrir cuando llegue al centro de parto o al hospital? Una vez que se inicie el trabajo de parto y haya sido admitida en el hospital o centro de parto, el mdico podr hacer lo siguiente:  Revisar sus antecedentes de embarazo y cualquier inquietud que usted pueda tener.  Colocarle una va intravenosa en una de las venas. Esto se podr usar para administrarle lquidos y medicamentos.  Verificar su presin arterial, pulso, temperatura y frecuencia cardaca (signos vitales).  Verificar si la bolsa de agua (saco amnitico) se ha roto (ruptura).  Hablar con usted sobre su plan de nacimiento y analizar las opciones para controlar el dolor. Monitoreo Su mdico puede monitorear las contracciones (monitoreo uterino) y la frecuencia cardaca del beb (monitoreo fetal). Es posible que el monitoreo se necesite realizar:  Con frecuencia, pero no continuamente (intermitentemente).  Todo el tiempo o durante largos perodos a la vez (continuamente). El monitoreo continuo puede ser necesario si: ? Est recibiendo determinados medicamentos, tales como medicamentos para aliviar el dolor o para hacer que las contracciones sean ms fuertes. ? Tiene complicaciones durante el embarazo o el trabajo de parto. El monitoreo se puede realizar:  Al colocar un estetoscopio especial o un dispositivo manual de monitoreo en el abdomen o verificar los latidos cardacos del beb y comprobar las contracciones.  Al colocar monitores en el abdomen (monitores externos) para registrar los latidos cardacos del beb y la frecuencia y duracin de  las contracciones.  Al colocar monitores dentro del tero a travs de la vagina (monitores internos) para registrar los latidos cardacos del beb y la frecuencia, duracin y fuerza de sus contracciones. Segn el tipo de monitor, puede permanecer en el tero o en la cabeza del beb hasta el nacimiento.  Telemetra. Se trata de un tipo de monitoreo continuo que se puede realizar con monitores externos o internos. En lugar de tener que permanecer en la cama, usted puede moverse durante la telemetra. Examen fsico Su mdico puede realizar exmenes fsicos frecuentes. Esto puede incluir lo siguiente:  Verificar cmo y dnde el beb est ubicado en el tero.  Verificar el cuello uterino para determinar: ? Si se est afinando o estirando (borrando). ? Si se est abriendo (dilatando). Qu sucede durante el trabajo de parto y el parto? El trabajo de parto y el parto normales se dividen en tres etapas: Etapa 1  Esta es la etapa ms larga del trabajo de parto.  Esta etapa puede durar horas o das.  Durante esta etapa, sentir contracciones. En general, las contracciones son leves, infrecuentes e irregulares al principio. Se hacen ms fuertes, ms frecuentes (aproximadamente cada 2 o 3 minutos) y ms regulares a medida que avanza en esta etapa.  Esta etapa finaliza cuando el cuello uterino est completamente dilatado hasta 4 pulgadas (10cm) y completamente borrado. Etapa 2  Esta etapa comienza una vez que el cuello uterino est totalmente borrado y dilatado, y dura hasta el nacimiento del beb.  Esta etapa puede durar de 20 minutos a 2 horas.  Esta es la etapa en la que va a sentir ganas de pujar al beb fuera de la vagina.    Puede sentir un dolor urente y por estiramiento, especialmente cuando la parte ms ancha de la cabeza del beb pasa a travs de la abertura vaginal (coronacin).  Una vez que el beb nace, el cordn umbilical se pinzar y se cortar. Esto ocurre por lo general despus  de un perodo de 1 a 2 minutos despus del parto.  Colocarn al beb sobre su pecho desnudo (contacto piel con piel) en una posicin erguida y [place] con Josefine Class abrigada. Observe al beb para detectar seales de hambre, como el reflejo de bsqueda o succin, y acrquelo al pecho para su primera alimentacin. Etapa 3  Esta etapa comienza inmediatamente despus del nacimiento del beb y finaliza despus de la expulsin de la placenta.  Esta etapa puede durar de 5 a 30 minutos.  Despus del nacimiento del beb, puede sentir contracciones cuando el cuerpo expulsa la placenta y el tero se contrae para Publishing copy.   Qu puedo esperar despus del [place] de parto y [place]?  Una vez que termine el trabajo de [place], se los controlar a usted y al beb atentamente para Best boy la seguridad de que ambos estn sanos y listos para ir a Holiday representative. Su equipo de atencin Building surveyor cmo cuidarse y cuidar a su beb.  Usted y el beb permanecern en la misma habitacin (cohabitacin) durante su estada en el hospital. Esto estimular una vinculacin temprana y Berdine Addison [place].  Puede seguir recibiendo lquidos o medicamentos por va intravenosa.  Se le controlar y Community education officer el tero con regularidad (masaje fndico).  Tendr algo de inflamacin y dolor en el abdomen, la vagina y la zona de la piel entre la abertura vaginal y el ano (perineo).  Si se le realiz una incisin cerca de la vagina (episiotoma) o si ha tenido Theme park manager parto, podran indicarle que se coloque compresas fras sobre la episiotoma o Psychiatrist. Esto ayuda a Best boy y la hinchazn.  Es posible que le den una botella rociadora para que use cuando vaya al bao para higienizarse. Siga los pasos a continuacin para usar la botella rociadora: ? Antes de orinar, llene la botella rociadora con agua tibia. No use agua caliente. ? Despus de Garment/textile technologist, [place] an est sentada en el inodoro,  use la botella rociadora para enjuagar el rea alrededor de la uretra y la abertura vaginal. Con esto podr limpiar cualquier rastro de orina y [place]. ? Llene la botella rociadora con agua limpia cada vez que vaya al bao.  Es normal tener hemorragia vaginal despus del [place]. Use un apsito sanitario para el sangrado vaginal y secrecin. Resumen  Parto vaginal significa que usted dar a luz empujando al beb fuera del canal del parto (vagina).  Su mdico puede monitorear las contracciones (monitoreo uterino) y la frecuencia cardaca del beb (monitoreo fetal).  Su mdico puede realizarle un examen fsico.  El trabajo de parto y el parto normales se dividen en tres etapas.  Una vez que termina el [place], se los controlar a usted y al beb atentamente hasta que estn listos para ir a casa. Esta informacin no tiene Marine scientist el consejo del mdico. Asegrese de hacerle al mdico cualquier pregunta que tenga. Document Revised: 04/24/2017 Document Reviewed: 04/24/2017 Elsevier Patient Education  2021 [employer].

## 2020-05-19 NOTE — Progress Notes (Signed)
Interpreter [employer] present for encounter. Pt is scheduled for IOL on 3/27. She reports decreased FM for several days. Pt felt good FM during NST/BPP.

## 2020-05-20 ENCOUNTER — Other Ambulatory Visit (HOSPITAL_COMMUNITY)
Admission: RE | Admit: 2020-05-20 | Discharge: 2020-05-20 | Disposition: A | Discharge status: Home / Self Care | Payer: Self-pay | Source: Ambulatory Visit | Attending: Family Medicine | Admitting: Family Medicine

## 2020-05-20 DIAGNOSIS — Z20822 Contact with and (suspected) exposure to covid-19: Secondary | ICD-10-CM | POA: Insufficient documentation

## 2020-05-20 DIAGNOSIS — Z01812 Encounter for preprocedural laboratory examination: Secondary | ICD-10-CM | POA: Insufficient documentation

## 2020-05-20 LAB — SARS CORONAVIRUS 2 (TAT 6-24 HRS): SARS Coronavirus 2: NEGATIVE

## 2020-05-22 ENCOUNTER — Inpatient Hospital Stay (HOSPITAL_COMMUNITY): Payer: Medicaid Other | Admitting: Anesthesiology

## 2020-05-22 ENCOUNTER — Inpatient Hospital Stay (HOSPITAL_COMMUNITY)
Admission: AD | Admit: 2020-05-22 | Discharge: 2020-05-24 | DRG: 807 | Disposition: A | Discharge status: Home / Self Care | Payer: Medicaid Other | Attending: Obstetrics & Gynecology | Admitting: Obstetrics & Gynecology

## 2020-05-22 ENCOUNTER — Inpatient Hospital Stay (HOSPITAL_COMMUNITY): Payer: Medicaid Other

## 2020-05-22 ENCOUNTER — Encounter (HOSPITAL_COMMUNITY): Payer: Self-pay | Admitting: Obstetrics and Gynecology

## 2020-05-22 ENCOUNTER — Other Ambulatory Visit: Payer: Self-pay

## 2020-05-22 DIAGNOSIS — O134 Gestational [pregnancy-induced] hypertension without significant proteinuria, complicating childbirth: Secondary | ICD-10-CM | POA: Diagnosis present

## 2020-05-22 DIAGNOSIS — O320XX0 Maternal care for unstable lie, not applicable or unspecified: Secondary | ICD-10-CM | POA: Diagnosis present

## 2020-05-22 DIAGNOSIS — Z3A39 39 weeks gestation of pregnancy: Secondary | ICD-10-CM

## 2020-05-22 DIAGNOSIS — O24425 Gestational diabetes mellitus in childbirth, controlled by oral hypoglycemic drugs: Secondary | ICD-10-CM | POA: Diagnosis present

## 2020-05-22 DIAGNOSIS — Z603 Acculturation difficulty: Secondary | ICD-10-CM | POA: Diagnosis present

## 2020-05-22 DIAGNOSIS — O09529 Supervision of elderly multigravida, unspecified trimester: Secondary | ICD-10-CM

## 2020-05-22 DIAGNOSIS — O24435 Gestational diabetes mellitus in puerperium, controlled by oral hypoglycemic drugs: Secondary | ICD-10-CM | POA: Diagnosis not present

## 2020-05-22 DIAGNOSIS — O139 Gestational [pregnancy-induced] hypertension without significant proteinuria, unspecified trimester: Secondary | ICD-10-CM

## 2020-05-22 DIAGNOSIS — Z789 Other specified health status: Secondary | ICD-10-CM | POA: Diagnosis present

## 2020-05-22 DIAGNOSIS — O135 Gestational [pregnancy-induced] hypertension without significant proteinuria, complicating the puerperium: Secondary | ICD-10-CM | POA: Diagnosis not present

## 2020-05-22 LAB — CBC
HCT: 37.7 % (ref 36.0–46.0)
Hemoglobin: 12.7 g/dL (ref 12.0–15.0)
MCH: 31.7 pg (ref 26.0–34.0)
MCHC: 33.7 g/dL (ref 30.0–36.0)
MCV: 94 fL (ref 80.0–100.0)
Platelets: 173 10*3/uL (ref 150–400)
RBC: 4.01 MIL/uL (ref 3.87–5.11)
RDW: 13.7 % (ref 11.5–15.5)
WBC: 6.6 10*3/uL (ref 4.0–10.5)
nRBC: 0 % (ref 0.0–0.2)

## 2020-05-22 LAB — TYPE AND SCREEN
ABO/RH(D): O POS
Antibody Screen: NEGATIVE

## 2020-05-22 LAB — RPR: RPR Ser Ql: NONREACTIVE

## 2020-05-22 MED ORDER — ACETAMINOPHEN 325 MG PO TABS: 650.0000 mg | ORAL_TABLET | ORAL | Status: DC | PRN

## 2020-05-22 MED ORDER — OXYTOCIN BOLUS FROM INFUSION
333.0000 mL | Freq: Once | INTRAVENOUS | Status: AC
  Administered 2020-05-22: 333 mL via INTRAVENOUS

## 2020-05-22 MED ORDER — FENTANYL CITRATE (PF) 100 MCG/2ML IJ SOLN
50.0000 ug | INTRAMUSCULAR | Status: DC | PRN
  Administered 2020-05-22: 50 ug via INTRAVENOUS
  Filled 2020-05-22: qty 2

## 2020-05-22 MED ORDER — OXYTOCIN-SODIUM CHLORIDE 30-0.9 UT/500ML-% IV SOLN
1.0000 m[IU]/min | INTRAVENOUS | Status: DC
  Administered 2020-05-22: 2 m[IU]/min via INTRAVENOUS

## 2020-05-22 MED ORDER — LIDOCAINE-EPINEPHRINE (PF) 2 %-1:200000 IJ SOLN
INTRAMUSCULAR | Status: DC | PRN
  Administered 2020-05-22: 5 mL via EPIDURAL

## 2020-05-22 MED ORDER — PHENYLEPHRINE 40 MCG/ML (10ML) SYRINGE FOR IV PUSH (FOR BLOOD PRESSURE SUPPORT): 80.0000 ug | PREFILLED_SYRINGE | INTRAVENOUS | Status: DC | PRN

## 2020-05-22 MED ORDER — TRANEXAMIC ACID-NACL 1000-0.7 MG/100ML-% IV SOLN
INTRAVENOUS | Status: AC
  Administered 2020-05-22: 1000 mg via INTRAVENOUS
  Filled 2020-05-22: qty 100

## 2020-05-22 MED ORDER — FENTANYL-BUPIVACAINE-NACL 0.5-0.125-0.9 MG/250ML-% EP SOLN
EPIDURAL | Status: AC
  Filled 2020-05-22: qty 250

## 2020-05-22 MED ORDER — SOD CITRATE-CITRIC ACID 500-334 MG/5ML PO SOLN: 30.0000 mL | ORAL | Status: DC | PRN

## 2020-05-22 MED ORDER — FENTANYL-BUPIVACAINE-NACL 0.5-0.125-0.9 MG/250ML-% EP SOLN
12.0000 mL/h | EPIDURAL | Status: DC | PRN
  Administered 2020-05-22: 12 mL/h via EPIDURAL

## 2020-05-22 MED ORDER — ONDANSETRON HCL 4 MG/2ML IJ SOLN: 4.0000 mg | Freq: Four times a day (QID) | INTRAMUSCULAR | Status: DC | PRN

## 2020-05-22 MED ORDER — MISOPROSTOL 50MCG HALF TABLET
ORAL_TABLET | ORAL | Status: AC
  Filled 2020-05-22: qty 1

## 2020-05-22 MED ORDER — TERBUTALINE SULFATE 1 MG/ML IJ SOLN: 0.2500 mg | Freq: Once | INTRAMUSCULAR | Status: DC | PRN

## 2020-05-22 MED ORDER — FENTANYL-BUPIVACAINE-NACL 0.5-0.125-0.9 MG/250ML-% EP SOLN: 12.0000 mL/h | EPIDURAL | Status: DC | PRN

## 2020-05-22 MED ORDER — TERBUTALINE SULFATE 1 MG/ML IJ SOLN
0.2500 mg | Freq: Once | INTRAMUSCULAR | Status: AC | PRN
  Administered 2020-05-22: 0.25 mg via SUBCUTANEOUS
  Filled 2020-05-22: qty 1

## 2020-05-22 MED ORDER — LIDOCAINE HCL (PF) 1 % IJ SOLN: 30.0000 mL | INTRAMUSCULAR | Status: DC | PRN

## 2020-05-22 MED ORDER — TRANEXAMIC ACID-NACL 1000-0.7 MG/100ML-% IV SOLN: 1000.0000 mg | Freq: Once | INTRAVENOUS | Status: AC

## 2020-05-22 MED ORDER — MISOPROSTOL 50MCG HALF TABLET
50.0000 ug | ORAL_TABLET | ORAL | Status: DC | PRN
  Administered 2020-05-22: 50 ug via BUCCAL

## 2020-05-22 MED ORDER — LACTATED RINGERS IV SOLN: 500.0000 mL | INTRAVENOUS | Status: DC | PRN

## 2020-05-22 MED ORDER — LACTATED RINGERS IV SOLN: INTRAVENOUS | Status: DC

## 2020-05-22 MED ORDER — LACTATED RINGERS IV SOLN: 500.0000 mL | Freq: Once | INTRAVENOUS | Status: AC

## 2020-05-22 MED ORDER — EPHEDRINE 5 MG/ML INJ: 10.0000 mg | INTRAVENOUS | Status: DC | PRN

## 2020-05-22 MED ORDER — LACTATED RINGERS IV SOLN
500.0000 mL | Freq: Once | INTRAVENOUS | Status: AC
  Administered 2020-05-22: 500 mL via INTRAVENOUS

## 2020-05-22 MED ORDER — OXYTOCIN-SODIUM CHLORIDE 30-0.9 UT/500ML-% IV SOLN
2.5000 [IU]/h | INTRAVENOUS | Status: DC
  Administered 2020-05-23: 2.5 [IU]/h via INTRAVENOUS
  Filled 2020-05-22: qty 500

## 2020-05-22 MED ORDER — DIPHENHYDRAMINE HCL 50 MG/ML IJ SOLN: 12.5000 mg | INTRAMUSCULAR | Status: DC | PRN

## 2020-05-22 NOTE — Progress Notes (Signed)
CBG 84. Glucometer not flowing over into Epic.  [name]

## 2020-05-22 NOTE — Progress Notes (Signed)
Suzanne Estes is a 40 y.o. [id number] at [redacted]w[redacted]d.  Subjective: Mild cramping. Received Cytotec 50 mcg Buccal @1238 .   Objective: BP 135/78   Pulse 80   Temp 98.5 F (36.9 C) (Oral)   Resp 18   Ht 5\' 2"  (1.575 m)   Wt 67.9 kg   LMP 08/22/2019   BMI 27.40 kg/m    FHT:  FHR: 140 bpm, variability: mod,  accelerations:  15x15,  decelerations:  none UC:   Q 2-6 minutes, mild Dilation: 3 Effacement (%): 60 Cervical Position: Posterior Station: -2 Presentation: Vertex Exam by:: V Keiasha Diep  Cervix dilated from 1 to 3 cm during attempted placement at foley so efforts were abandoned since foley was no longer needed.    Labs: Results for orders placed or performed during the hospital encounter of 05/22/20 (from the past 24 hour(s))  CBC     Status: None   Collection Time: 05/22/20  9:10 AM  Result Value Ref Range   WBC 6.6 4.0 - 10.5 K/uL   RBC 4.01 3.87 - 5.11 MIL/uL   Hemoglobin 12.7 12.0 - 15.0 g/dL   HCT 05/24/20 05/24/20 - 54.6 %   MCV 94.0 80.0 - 100.0 fL   MCH 31.7 26.0 - 34.0 pg   MCHC 33.7 30.0 - 36.0 g/dL   RDW 56.8 12.7 - 51.7 %   Platelets 173 150 - 400 K/uL   nRBC 0.0 0.0 - 0.2 %  Type and screen     Status: None   Collection Time: 05/22/20  9:10 AM  Result Value Ref Range   ABO/RH(D) O POS    Antibody Screen NEG    Sample Expiration      05/25/2020,2359 Performed at [facility], [address], [place], [address] [place]   RPR     Status: None   Collection Time: 05/22/20  9:10 AM  Result Value Ref Range   RPR Ser Ql NON REACTIVE NON REACTIVE   CBG 86, 79  Assessment / Plan: [redacted]w[redacted]d week IUP Labor: Early/IOL. Progressing well w/ Cytotec. Plan Pitocin 4 hours after Cytotec dose if needed.  A2GDM: CBGs nml. No meds needed at this time.  Fetal Wellbeing:  Category I Pain Control:  Comfort measures Anticipated MOD:  SVD  05/24/20, CNM 05/22/2020 2:37 PM

## 2020-05-22 NOTE — Anesthesia Preprocedure Evaluation (Addendum)
Anesthesia Evaluation  Patient identified by MRN, date of birth, ID band Patient awake    Reviewed: Allergy & Precautions, Patient's Chart, lab work & pertinent test results  Airway Mallampati: I       Dental no notable dental hx.    Pulmonary    Pulmonary exam normal        Cardiovascular Normal cardiovascular exam     Neuro/Psych    GI/Hepatic   Endo/Other  diabetes, Gestational  Renal/GU      Musculoskeletal   Abdominal   Peds  Hematology   Anesthesia Other Findings   Reproductive/Obstetrics (+) Pregnancy                            Anesthesia Physical Anesthesia Plan  ASA: II  Anesthesia Plan: Epidural   Post-op Pain Management:    Induction:   PONV Risk Score and Plan: 0  Airway Management Planned: Natural Airway  Additional Equipment: None  Intra-op Plan:   Post-operative Plan:   Informed Consent: I have reviewed the patients History and Physical, chart, labs and discussed the procedure including the risks, benefits and alternatives for the proposed anesthesia with the patient or authorized representative who has indicated his/her understanding and acceptance.     Interpreter used for [employer] Discussed with:   Anesthesia Plan Comments: (Lab Results      Component                Value               Date                      WBC                      6.6                 05/22/2020                HGB                      12.7                05/22/2020                HCT                      37.7                05/22/2020                MCV                      94.0                05/22/2020                PLT                      173                 05/22/2020           )       Anesthesia Quick Evaluation

## 2020-05-22 NOTE — Progress Notes (Signed)
CBG 86. Glucometer not transferring into epic.  [name]

## 2020-05-22 NOTE — H&P (Signed)
OBSTETRIC ADMISSION HISTORY AND PHYSICAL  Suzanne Estes is a 40 y.o. female [phone] with IUP at [redacted]w[redacted]d by LMP presenting for IOL due to A2GDM. She reports +FMs, no LOF, no VB, no blurry vision, headaches or peripheral edema, and RUQ pain. She plans on breast and formula feeding. She request POPs for birth control. She received her prenatal care at [facility]   Dating: By LMP --->  Estimated Date of Delivery: 05/28/20  Sono: @[redacted]w[redacted]d , CWD, normal anatomy, cephalic presentation, posterior placenta lie, 1709g, 63% EFW   Prenatal History/Complications:  -A2GDM: On Metformin 1000mg  qAM and 500mg  qHS. Per patient, sugars typically range 70s-120s. EFW 63%ile at [redacted]w[redacted]d. -AMA (39y/o) -Unstable lie   Past Medical History: Past Medical History:  Diagnosis Date  . Acute appendicitis 05/18/2014  . Appendicitis, acute 05/17/14  . Gestational diabetes     Past Surgical History: Past Surgical History:  Procedure Laterality Date  . APPENDECTOMY    . LAPAROSCOPIC APPENDECTOMY  05/18/14   Dr. 05/20/2014  . LAPAROSCOPIC APPENDECTOMY N/A 05/18/2014   Procedure: APPENDECTOMY LAPAROSCOPIC;  Surgeon: 05/20/14, MD;  Location: [facility] OR;  Service: General;  Laterality: N/A;    Obstetrical History: OB History    Gravida  5   Para  3   Term  3   Preterm      AB  1   Living  3     SAB  1   IAB      Ectopic      Multiple      Live Births  3           Social History Social History   Socioeconomic History  . Marital status: Married    Spouse name: Not on file  . Number of children: Not on file  . Years of education: Not on file  . Highest education level: Not on file  Occupational History  . Not on file  Tobacco Use  . Smoking status: Never Smoker  . Smokeless tobacco: Never Used  Vaping Use  . Vaping Use: Never used  Substance and Sexual Activity  . Alcohol use: No  . Drug use: No  . Sexual activity: Not Currently    Birth control/protection: None, Condom    Comment:  condoms at to,e  Other Topics Concern  . Not on file  Social History Narrative  . Not on file   Social Determinants of Health   Financial Resource Strain: Not on file  Food Insecurity: Food Insecurity Present  . Worried About Derrell Lolling in the Last Year: Sometimes true  . Ran Out of Food in the Last Year: Never true  Transportation Needs: No Transportation Needs  . Lack of Transportation (Medical): No  . Lack of Transportation (Non-Medical): No  Physical Activity: Not on file  Stress: Not on file  Social Connections: Not on file    Family History: Family History  Problem Relation Age of Onset  . Cancer Father   . Prostate cancer Father   . Cancer Brother   . Stomach cancer Brother   . Anesthesia problems Neg Hx     Allergies: No Known Allergies  Medications Prior to Admission  Medication Sig Dispense Refill Last Dose  . metFORMIN (GLUCOPHAGE) 500 MG tablet Take 2 pills in the morning with breakfast and 1 pill in the evening with dinner. For patient: Tome 2 pastillas con el desayuno de la manana y 1 pastilla en la noche con la cena. 90 tablet 1 05/21/2020 at  Unknown time  . Prenatal Vit-Fe Fumarate-FA (PRENATAL VITAMINS) 28-0.8 MG TABS Take 1 tablet by mouth daily. 90 tablet 2 05/21/2020 at Unknown time  . acetaminophen (TYLENOL) 325 MG tablet Take 650 mg by mouth every 6 (six) hours as needed. (Patient not taking: No sig reported)   More than a month at Unknown time  . aspirin EC 81 MG tablet Take 1 tablet (81 mg total) by mouth daily. Take after 12 weeks for prevention of preeclampsia later in pregnancy (Patient not taking: No sig reported) 300 tablet 2 More than a month at Unknown time     Review of Systems   All systems reviewed and negative except as stated in HPI  Blood pressure 132/82, pulse 79, resp. rate 18, height 5\' 2"  (1.575 m), weight 67.9 kg, last menstrual period 08/22/2019, unknown if currently breastfeeding. General appearance: alert, cooperative  and no distress Lungs: normal WOB Abdomen: gravid Extremities: no sign of DVT Presentation: unstable lie-- initially breech, then transverse, then cephalic by bedside ultrasound Fetal monitoringBaseline: 140 bpm, Variability: Good {> 6 bpm), Accelerations: Reactive and Decelerations: Absent Uterine activityNone Dilation: 1 Effacement (%): Thick Station:  (unable to reach presenting part) Exam by:: [email or web address], RN   Prenatal labs: ABO, Rh: O/Positive/-- (09/27 07-28-2005) Antibody: Negative (09/27 0923) Rubella: 19.00 (09/27 0923) RPR: Non Reactive (01/12 0835)  HBsAg: Negative (09/27 07-28-2005)  HIV: Non Reactive (01/12 0835)  GBS: Negative/-- (03/10 0837)  2 hr Glucola abnormal (95/213/176) Genetic screening NIPS- Low risk female Anatomy 07[birth year]16-1992 normal  Prenatal Transfer Tool  Maternal Diabetes: Yes:  Diabetes Type:  Insulin/Medication controlled Genetic Screening: Normal Maternal Ultrasounds/Referrals: Normal Fetal Ultrasounds or other Referrals:  None Maternal Substance Abuse:  No Significant Maternal Medications:  None Significant Maternal Lab Results: Group B Strep negative  Results for orders placed or performed during the hospital encounter of 05/22/20 (from the past 24 hour(s))  CBC   Collection Time: 05/22/20  9:10 AM  Result Value Ref Range   WBC 6.6 4.0 - 10.5 K/uL   RBC 4.01 3.87 - 5.11 MIL/uL   Hemoglobin 12.7 12.0 - 15.0 g/dL   HCT 05/24/20 81.8 - 29.9 %   MCV 94.0 80.0 - 100.0 fL   MCH 31.7 26.0 - 34.0 pg   MCHC 33.7 30.0 - 36.0 g/dL   RDW 37.1 69.6 - 78.9 %   Platelets 173 150 - 400 K/uL   nRBC 0.0 0.0 - 0.2 %    Patient Active Problem List   Diagnosis Date Noted  . Unstable lie of fetus 05/05/2020  . Gestational diabetes 03/18/2020  . Supervision of high risk pregnancy, antepartum 11/05/2019  . AMA (advanced maternal age) multigravida 35+ 11/05/2019  . Language barrier 11/05/2019  . Family history of malignant neoplasm of gastrointestinal tract 02/06/2017     Assessment/Plan:  Suzanne Estes is a 40 y.o. 40 at [redacted]w[redacted]d here for IOL due to A2GDM.  #IOL for A2GDM, #unstable lie: Patient with unstable lie on admission-- initially breech, then became transverse and spontaneously became cephalic (all by bedside u/s). Abdominal binder in place. Cervix 1/thick. Will plan for buccal cytotec and FB placement. Will recheck position by bedside ultrasound frequently. #A2GDM: On Metformin 1000mg  qAM and 500mg  qHS at home. Reports good glycemic control on Metformin. EFW 63%ile. CBG monitoring q4h in latent labor, q2h in active labor. #Pain: PRN, desires epidural #FWB: Cat I strip #ID: GBS negative #MOF:  Breast and Formula #MOC: POPs #Circ: Declines #Language barrier: spanish-speaking. In-person interpreter used for  duration of encounter #AMA: 40 y/o  Maury Dus, MD  05/22/2020, 9:45 AM

## 2020-05-22 NOTE — Progress Notes (Signed)
LABOR PROGRESS NOTE  Suzanne Estes is a 40 y.o. [phone] at [redacted]w[redacted]d admitted for IOL- A2GDM  Subjective: Patient resting comfortably. Feeling minimal contractions.  Objective: BP (!) 147/88   Pulse 92   Temp 98.4 F (36.9 C) (Oral)   Resp 18   Ht 5\' 2"  (1.575 m)   Wt 67.9 kg   LMP 08/22/2019   BMI 27.40 kg/m  or  Vitals:   05/22/20 1535 05/22/20 1640 05/22/20 1656 05/22/20 1657  BP: 138/75 129/77 (!) 134/93 (!) 147/88  Pulse: 95 85 84 92  Resp: 16 18 18 18   Temp:   98.4 F (36.9 C)   TempSrc:   Oral   Weight:      Height:        Dilation: 3 Effacement (%): 70 Cervical Position: Middle Station: -2 Presentation: Vertex Exam by:: Dr & [email or web address] FHT: baseline rate 150, moderate variability, +acel, no decels Toco: q3-5 mins  Labs: Lab Results  Component Value Date   WBC 6.6 05/22/2020   HGB 12.7 05/22/2020   HCT 37.7 05/22/2020   MCV 94.0 05/22/2020   PLT 173 05/22/2020    Patient Active Problem List   Diagnosis Date Noted  . Unstable lie of fetus 05/05/2020  . Gestational diabetes 03/18/2020  . Supervision of high risk pregnancy, antepartum 11/05/2019  . AMA (advanced maternal age) multigravida 35+ 11/05/2019  . Language barrier 11/05/2019  . Family history of malignant neoplasm of gastrointestinal tract 02/06/2017    Assessment / Plan: 40 y.o. 14/01/2017 at [redacted]w[redacted]d here for IOL-A2GDM  IOL: Progressing appropriately s/p cytotec x1. Cervix 3/70/-2 with this check. Will start pitocin 2x2 and up-titrate as appropriate. A2GDM: CBG wnl since admission. Most recent 84. Continue q4h CBG in latent labor, q2h in active. Holding home Metformin. EFW 63%ile. Fetal Wellbeing:  Cat I strip Pain Control:  Prn, desires epidural Anticipated MOD:  Vaginal Language barrier: in-person spanish interpreter used  [redacted]w[redacted]d, MD PGY-1 [facility] 05/22/2020, 5:07 PM

## 2020-05-22 NOTE — Anesthesia Procedure Notes (Signed)
Epidural Patient location during procedure: OB Start time: 05/22/2020 8:17 PM End time: 05/22/2020 8:03 PM  Staffing Anesthesiologist: Shelton Silvas, MD Performed: anesthesiologist   Preanesthetic Checklist Completed: patient identified, IV checked, site marked, risks and benefits discussed, surgical consent, monitors and equipment checked, pre-op evaluation and timeout performed  Epidural Patient position: sitting Prep: DuraPrep Patient monitoring: heart rate, continuous pulse ox and blood pressure Approach: midline Location: L3-L4 Injection technique: LOR saline  Needle:  Needle type: Tuohy  Needle gauge: 17 G Needle length: 9 cm Catheter type: closed end flexible Catheter size: 20 Guage Test dose: negative and 1.5% lidocaine  Assessment Events: blood not aspirated, injection not painful, no injection resistance and no paresthesia  Additional Notes LOR @ 4  Patient identified. Risks/Benefits/Options discussed with patient including but not limited to bleeding, infection, nerve damage, paralysis, failed block, incomplete pain control, headache, blood pressure changes, nausea, vomiting, reactions to medications, itching and postpartum back pain. Confirmed with bedside nurse the patient's most recent platelet count. Confirmed with patient that they are not currently taking any anticoagulation, have any bleeding history or any family history of bleeding disorders. Patient expressed understanding and wished to proceed. All questions were answered. Sterile technique was used throughout the entire procedure. Please see nursing notes for vital signs. Test dose was given through epidural catheter and negative prior to continuing to dose epidural or start infusion. Warning signs of high block given to the patient including shortness of breath, tingling/numbness in hands, complete motor block, or any concerning symptoms with instructions to call for help. Patient was given instructions on  fall risk and not to get out of bed. All questions and concerns addressed with instructions to call with any issues or inadequate analgesia.    Reason for block:procedure for pain

## 2020-05-22 NOTE — Progress Notes (Signed)
Suzanne Estes is a 40 y.o. [id number] at [redacted]w[redacted]d.  CTBS to confirm presentation. RN concerned that baby has turned again. Abd binder in place. Informal BS US demonstrated Vtx presentation. Cytotec started. Plan Foley at next check.   Katrinka Blazing, [place], CNM 05/22/2020 1:22 PM

## 2020-05-22 NOTE — Progress Notes (Signed)
CBG 79. Glucometer not transferring to epic.  [name]

## 2020-05-23 ENCOUNTER — Encounter (HOSPITAL_COMMUNITY): Payer: Self-pay | Admitting: Obstetrics and Gynecology

## 2020-05-23 DIAGNOSIS — O24435 Gestational diabetes mellitus in puerperium, controlled by oral hypoglycemic drugs: Secondary | ICD-10-CM

## 2020-05-23 DIAGNOSIS — O139 Gestational [pregnancy-induced] hypertension without significant proteinuria, unspecified trimester: Secondary | ICD-10-CM

## 2020-05-23 DIAGNOSIS — O135 Gestational [pregnancy-induced] hypertension without significant proteinuria, complicating the puerperium: Secondary | ICD-10-CM

## 2020-05-23 LAB — COMPREHENSIVE METABOLIC PANEL
ALT: 11 U/L (ref 0–44)
AST: 22 U/L (ref 15–41)
Albumin: 2.5 g/dL — ABNORMAL LOW (ref 3.5–5.0)
Alkaline Phosphatase: 149 U/L — ABNORMAL HIGH (ref 38–126)
Anion gap: 6 (ref 5–15)
BUN: <5 mg/dL — ABNORMAL LOW (ref 6–20)
CO2: 23 mmol/L (ref 22–32)
Calcium: 8.5 mg/dL — ABNORMAL LOW (ref 8.9–10.3)
Chloride: 108 mmol/L (ref 98–111)
Creatinine, Ser: 0.54 mg/dL (ref 0.44–1.00)
GFR, Estimated: >60 mL/min (ref >60)
Glucose, Bld: 91 mg/dL (ref 70–99)
Potassium: 3.8 mmol/L (ref 3.5–5.1)
Sodium: 137 mmol/L (ref 135–145)
Total Bilirubin: 0.8 mg/dL (ref 0.3–1.2)
Total Protein: 5 g/dL — ABNORMAL LOW (ref 6.5–8.1)

## 2020-05-23 LAB — GLUCOSE, CAPILLARY
Glucose-Capillary: 79 mg/dL (ref 70–99)
Glucose-Capillary: 86 mg/dL (ref 70–99)
Glucose-Capillary: 87 mg/dL (ref 70–99)
Glucose-Capillary: 82 mg/dL (ref 70–99)
Glucose-Capillary: 87 mg/dL (ref 70–99)
Glucose-Capillary: 103 mg/dL — ABNORMAL HIGH (ref 70–99)

## 2020-05-23 LAB — CBC
HCT: 37.6 % (ref 36.0–46.0)
Hemoglobin: 12.8 g/dL (ref 12.0–15.0)
MCH: 32.3 pg (ref 26.0–34.0)
MCHC: 34 g/dL (ref 30.0–36.0)
MCV: 94.9 fL (ref 80.0–100.0)
Platelets: 165 10*3/uL (ref 150–400)
RBC: 3.96 MIL/uL (ref 3.87–5.11)
RDW: 13.9 % (ref 11.5–15.5)
WBC: 12.3 10*3/uL — ABNORMAL HIGH (ref 4.0–10.5)
nRBC: 0 % (ref 0.0–0.2)

## 2020-05-23 MED ORDER — ONDANSETRON HCL 4 MG/2ML IJ SOLN: 4.0000 mg | INTRAMUSCULAR | Status: DC | PRN

## 2020-05-23 MED ORDER — COCONUT OIL OIL: 1.0000 "application " | TOPICAL_OIL | Status: DC | PRN

## 2020-05-23 MED ORDER — SENNOSIDES-DOCUSATE SODIUM 8.6-50 MG PO TABS
2.0000 | ORAL_TABLET | Freq: Every day | ORAL | Status: DC
  Administered 2020-05-23 – 2020-05-24 (×2): 2 via ORAL
  Filled 2020-05-23: qty 2

## 2020-05-23 MED ORDER — BENZOCAINE-MENTHOL 20-0.5 % EX AERO: 1.0000 "application " | INHALATION_SPRAY | CUTANEOUS | Status: DC | PRN

## 2020-05-23 MED ORDER — ONDANSETRON HCL 4 MG PO TABS
4.0000 mg | ORAL_TABLET | ORAL | Status: DC | PRN
Start: 2020-05-23 — End: 2020-05-24

## 2020-05-23 MED ORDER — ACETAMINOPHEN 325 MG PO TABS: 650.0000 mg | ORAL_TABLET | ORAL | Status: DC | PRN

## 2020-05-23 MED ORDER — TETANUS-DIPHTH-ACELL PERTUSSIS 5-2.5-18.5 LF-MCG/0.5 IM SUSY: 0.5000 mL | PREFILLED_SYRINGE | Freq: Once | INTRAMUSCULAR | Status: DC

## 2020-05-23 MED ORDER — SIMETHICONE 80 MG PO CHEW
80.0000 mg | CHEWABLE_TABLET | ORAL | Status: DC | PRN
Start: 2020-05-23 — End: 2020-05-24

## 2020-05-23 MED ORDER — DIPHENHYDRAMINE HCL 25 MG PO CAPS
25.0000 mg | ORAL_CAPSULE | Freq: Four times a day (QID) | ORAL | Status: DC | PRN
Start: 2020-05-23 — End: 2020-05-24

## 2020-05-23 MED ORDER — DIBUCAINE (PERIANAL) 1 % EX OINT
1.0000 | TOPICAL_OINTMENT | CUTANEOUS | Status: DC | PRN
Start: 2020-05-23 — End: 2020-05-24
  Administered 2020-05-24: 1 via RECTAL
  Filled 2020-05-23: qty 28

## 2020-05-23 MED ORDER — WITCH HAZEL-GLYCERIN EX PADS: 1.0000 "application " | MEDICATED_PAD | CUTANEOUS | Status: DC | PRN

## 2020-05-23 MED ORDER — PRENATAL MULTIVITAMIN CH
1.0000 | ORAL_TABLET | Freq: Every day | ORAL | Status: DC
  Administered 2020-05-24: 1 via ORAL
  Filled 2020-05-23 (×2): qty 1

## 2020-05-23 MED ORDER — IBUPROFEN 600 MG PO TABS
600.0000 mg | ORAL_TABLET | Freq: Four times a day (QID) | ORAL | Status: DC
  Administered 2020-05-23 – 2020-05-24 (×4): 600 mg via ORAL
  Filled 2020-05-23 (×6): qty 1

## 2020-05-23 MED ORDER — ZOLPIDEM TARTRATE 5 MG PO TABS: 5.0000 mg | ORAL_TABLET | Freq: Every evening | ORAL | Status: DC | PRN

## 2020-05-23 NOTE — Anesthesia Postprocedure Evaluation (Signed)
Anesthesia Post Note  Patient: Bryon Lions Aguilar-Martinez  Procedure(s) Performed: AN AD HOC LABOR EPIDURAL     Patient location during evaluation: Mother Baby Anesthesia Type: Epidural Level of consciousness: awake and alert Pain management: pain level controlled Vital Signs Assessment: post-procedure vital signs reviewed and stable Respiratory status: spontaneous breathing, nonlabored ventilation and respiratory function stable Cardiovascular status: stable Postop Assessment: no headache, no backache, epidural receding, no apparent nausea or vomiting, adequate PO intake and able to ambulate Anesthetic complications: no   No complications documented.  Last Vitals:  Vitals:   05/23/20 0330 05/23/20 0626  BP: 112/80 107/62  Pulse: 86 81  Resp: 18 18  Temp: 36.8 C 36.9 C  SpO2: 100% 100%    Last Pain:  Vitals:   05/23/20 0626  TempSrc: Oral  PainSc: 0-No pain   Pain Goal:                   Blythe Stanford

## 2020-05-23 NOTE — Discharge Instructions (Signed)

## 2020-05-23 NOTE — Discharge Summary (Signed)
Postpartum Discharge Summary    Patient Name: Suzanne Estes DOB: December 17, 1980 MRN: [id number]  Date of admission: 05/22/2020 Delivery date:05/22/2020  Delivering provider: Randa Ngo  Date of discharge: 05/24/2020  Admitting diagnosis: Supervision of high risk pregnancy, antepartum [O09.90] Intrauterine pregnancy: [redacted]w[redacted]d     Secondary diagnosis:  Principal Problem:   Vaginal delivery Active Problems:   AMA (advanced maternal age) multigravida 35+   Language barrier   Unstable lie of fetus   Gestational hypertension  Additional problems: as noted above   Discharge diagnosis: Term Pregnancy Delivered                                              Post partum procedures:none Augmentation: Pitocin and Cytotec Complications: None  Hospital course: Induction of Labor With Vaginal Delivery   40 y.o. yo [id number] at [redacted]w[redacted]d was admitted to the hospital 05/22/2020 for induction of labor.  Indication for induction: A2 DM.  Patient had an uncomplicated labor course as follows: Membrane Rupture Time/Date: 9:05 PM ,05/22/2020   Delivery Method:Vaginal, Spontaneous  Episiotomy: None  Lacerations:  None  Details of delivery can be found in separate delivery note.  Patient had a routine postpartum course. Patient is discharged home 05/24/20.  Newborn Data: Birth date:05/22/2020  Birth time:11:44 PM  Gender:Female  Living status:Living  Apgars:9 ,9  Weight:3235 g   Magnesium Sulfate received: No BMZ received: No Rhophylac:N/A MMR:N/A T-DaP:Given prenatally  Flu: Yes Transfusion:No  Physical exam  Vitals:   05/23/20 1038 05/23/20 1528 05/23/20 2213 05/24/20 0653  BP: 119/71 117/70 126/79 118/75  Pulse: 85 76 74 78  Resp: $Remo'16 17 17 18  'wBzAw$ Temp: 98.8 F (37.1 C) 98 F (36.7 C) 97.8 F (36.6 C) 97.9 F (36.6 C)  TempSrc: Oral Oral Oral Oral  SpO2: 100% 99% 98%   Weight:      Height:       General: alert, cooperative and no distress Lochia: appropriate Uterine Fundus:  firm Incision: N/A DVT Evaluation: No evidence of DVT seen on physical exam. No cords or calf tenderness. No significant calf/ankle edema. Labs: Lab Results  Component Value Date   WBC 12.3 (H) 05/23/2020   HGB 12.8 05/23/2020   HCT 37.6 05/23/2020   MCV 94.9 05/23/2020   PLT 165 05/23/2020   CMP Latest Ref Rng & Units 05/23/2020  Glucose 70 - 99 mg/dL 91  BUN 6 - 20 mg/dL <5(L)  Creatinine 0.44 - 1.00 mg/dL 0.54  Sodium 135 - 145 mmol/L 137  Potassium 3.5 - 5.1 mmol/L 3.8  Chloride 98 - 111 mmol/L 108  CO2 22 - 32 mmol/L 23  Calcium 8.9 - 10.3 mg/dL 8.5(L)  Total Protein 6.5 - 8.1 g/dL 5.0(L)  Total Bilirubin 0.3 - 1.2 mg/dL 0.8  Alkaline Phos 38 - 126 U/L 149(H)  AST 15 - 41 U/L 22  ALT 0 - 44 U/L 11   Edinburgh Score: Edinburgh Postnatal Depression Scale Screening Tool 05/23/2020  I have been able to laugh and see the funny side of things. 0  I have looked forward with enjoyment to things. 1  I have blamed myself unnecessarily when things went wrong. 0  I have been anxious or worried for no good reason. 3  I have felt scared or panicky for no good reason. 0  Things have been getting on top of me. 0  I have been so unhappy that I have had difficulty sleeping. 0  I have felt sad or miserable. 0  I have been so unhappy that I have been crying. 0  The thought of harming myself has occurred to me. 0  Edinburgh Postnatal Depression Scale Total 4     After visit meds:  Allergies as of 05/24/2020   No Known Allergies     Medication List    STOP taking these medications   metFORMIN 500 MG tablet Commonly known as: Glucophage     TAKE these medications   acetaminophen 325 MG tablet Commonly known as: Tylenol Take 2 tablets (650 mg total) by mouth every 6 (six) hours as needed for mild pain, moderate pain, fever or headache (for pain scale < 4).   coconut oil Oil Apply 1 application topically as needed (nipple pain).   ibuprofen 600 MG tablet Commonly known as:  ADVIL Take 1 tablet (600 mg total) by mouth every 6 (six) hours.   norethindrone 0.35 MG tablet Commonly known as: Ortho Micronor Take 1 tablet (0.35 mg total) by mouth daily.   Prenatal Vitamins 28-0.8 MG Tabs Take 1 tablet by mouth daily.        Discharge home in stable condition Infant Feeding: Breast Infant Disposition:home with mother Discharge instruction: per After Visit Summary and Postpartum booklet. Activity: Advance as tolerated. Pelvic rest for 6 weeks.  Diet: routine diet Future Appointments: Future Appointments  Date Time Provider [place]  06/20/2020  9:15 AM Ephraim Hamburger, Rona Ravens, NP [facility]   Follow up Visit: Message sent to [facility] by Dr. Astrid Drafts.  Please schedule this patient for a In person postpartum visit in 6 weeks with the following provider: Any provider. Additional Postpartum F/U:2 hour GTT and BP check 1 week  High risk pregnancy complicated by: U5JDY (metformin in pregnancy), gHTN  Delivery mode:  Vaginal, Spontaneous  Anticipated Birth Control: partner vasectomy  Donnavan Covault, Gildardo Cranker, MD OB Fellow, Faculty [facility] 05/24/2020 9:17 AM

## 2020-05-23 NOTE — Progress Notes (Signed)
Pt has fasting glucose in for this morning, but pt has not been fasting. Last ate @ 0300. This RN ordered pt breakfast for 0800. Pt knows to call out before consuming breakfast for glucose check.    Elvia Collum, RN 05/23/20

## 2020-05-23 NOTE — Plan of Care (Signed)
  Problem: Education: Goal: Knowledge of Childbirth will improve Outcome: Adequate for Discharge Goal: Ability to make informed decisions regarding treatment and plan of care will improve Outcome: Adequate for Discharge Goal: Ability to state and carry out methods to decrease the pain will improve Outcome: Adequate for Discharge Goal: Individualized Educational Video(s) Outcome: Not Applicable   Problem: Coping: Goal: Ability to verbalize concerns and feelings about labor and delivery will improve Outcome: Completed/Met   Problem: Life Cycle: Goal: Ability to make normal progression through stages of labor will improve Outcome: Completed/Met Goal: Ability to effectively push during vaginal delivery will improve Outcome: Completed/Met   Problem: Safety: Goal: Risk of complications during labor and delivery will decrease Outcome: Adequate for Discharge   Problem: Pain Management: Goal: Relief or control of pain from uterine contractions will improve Outcome: Adequate for Discharge

## 2020-05-23 NOTE — Progress Notes (Addendum)
POSTPARTUM PROGRESS NOTE  PPD #1  Subjective:  Suzanne Estes is a 40 y.o. [id number] s/p IOL for A2GDM and VD at [redacted]w[redacted]d. No acute events overnight. She reports she is doing well. She denies any problems with ambulating, voiding or po intake. Denies nausea or vomiting. She has passed flatus. Pain is well controlled.  Lochia is appropriate.  Objective: Blood pressure 107/62, pulse 81, temperature 98.4 F (36.9 C), temperature source Oral, resp. rate 18, height 5\' 2"  (1.575 m), weight 67.9 kg, last menstrual period 08/22/2019, SpO2 100 %, unknown if currently breastfeeding.  Physical Exam:  General: alert, cooperative and no distress Chest: no respiratory distress Uterine Fundus: firm, appropriately tender DVT Evaluation: No calf swelling or tenderness Extremities: 1+ edema Skin: warm, dry  Recent Labs    05/22/20 0910 05/23/20 0052  HGB 12.7 12.8  HCT 37.7 37.6    Assessment/Plan: Suzanne Estes is a 40 y.o. 41 s/p vaginal delivery at [redacted]w[redacted]d in setting of IOL for A2GDM.  PPD#1- Doing welll; pain well controlled. H/H appropriate  Routine postpartum care  OOB, ambulated  Contraception: POPs  Feeding: both  A2GDM: FBG 91, wnl  GHTN: asymptomatic. Overall normal Bps s/p delivery. Continue to monitor.  Dispo: Pt doing well approx. 8 hrs post delivery. Monitor BP and evaluate for discharge tomorrow, 05/24/2020.    LOS: 1 day   05/26/2020, MS3 05/23/2020, 6:54 AM  Attestation of Supervision of Student:  I confirm that I have verified the information documented in the medical student's note and that I have also personally reperformed the history, physical exam and all medical decision making activities.  I have verified that all services and findings are accurately documented in this student's note; and I agree with management and plan as outlined in the documentation. I have also made any necessary editorial changes.  05/25/2020, MD Center for [facility], [facility] 05/23/2020 9:06 AM

## 2020-05-24 MED ORDER — ACETAMINOPHEN 325 MG PO TABS: 650.0000 mg | ORAL_TABLET | Freq: Four times a day (QID) | ORAL | Status: AC | PRN

## 2020-05-24 MED ORDER — IBUPROFEN 600 MG PO TABS
600.0000 mg | ORAL_TABLET | Freq: Four times a day (QID) | ORAL | 0 refills | Status: AC
Start: 2020-05-24 — End: ?

## 2020-05-24 MED ORDER — COCONUT OIL OIL: 1.0000 "application " | TOPICAL_OIL | 0 refills | Status: AC | PRN

## 2020-05-24 MED ORDER — NORETHINDRONE 0.35 MG PO TABS: 1.0000 | ORAL_TABLET | Freq: Every day | ORAL | 6 refills | Status: AC

## 2020-05-24 NOTE — Progress Notes (Signed)
Intrepreter # [id number] used to explain the days plan and asked for any needs. Patient verbalized understanding and expressed any needs. Hospital Ipad was used for interpreter.

## 2020-05-24 NOTE — Progress Notes (Signed)
Intrepeter # [id number] used for discharge. Patient verbalized and demonstrated understanding

## 2020-06-20 ENCOUNTER — Other Ambulatory Visit: Payer: Self-pay

## 2020-06-20 ENCOUNTER — Ambulatory Visit (INDEPENDENT_AMBULATORY_CARE_PROVIDER_SITE_OTHER): Payer: Self-pay | Admitting: Advanced Practice Midwife

## 2020-06-20 DIAGNOSIS — O24415 Gestational diabetes mellitus in pregnancy, controlled by oral hypoglycemic drugs: Secondary | ICD-10-CM

## 2020-06-20 DIAGNOSIS — Z789 Other specified health status: Secondary | ICD-10-CM

## 2020-06-20 MED ORDER — DOCUSATE SODIUM 100 MG PO CAPS: 100.0000 mg | ORAL_CAPSULE | Freq: Two times a day (BID) | ORAL | 2 refills | Status: AC | PRN

## 2020-06-20 NOTE — Progress Notes (Signed)
    Post Partum Visit Note  Suzanne Estes is a 40 y.o. [phone] female who presents for a postpartum visit. She is 4 weeks postpartum following a normal spontaneous vaginal delivery.  I have fully reviewed the prenatal and intrapartum course. The delivery was at 39.2 gestational weeks.  Anesthesia: epidural. Postpartum course has been uncomplicated. Baby is doing well. Baby is feeding by bottle - Gerber Gentle. Bleeding no bleeding. Bowel function is abnormal: Straining. Bladder function is normal. Patient is not sexually active. Contraception method is oral progesterone-only contraceptive. Postpartum depression screening: negative.  The pregnancy intention screening data noted above was reviewed. Potential methods of contraception were discussed. The patient elected to proceed with Oral Contraceptive.   [Common ambulatory SmartLinks:[id number]}  Review of Systems A comprehensive review of systems was negative.  Objective:  There were no vitals taken for this visit.   General:  alert, cooperative, appears stated age and no distress   Breasts:  normal  Lungs: Normal work of breathing  Heart:  regular rate and rhythm, S1, S2 normal, no murmur, click, rub or gallop  GU exam:  normal       Assessment:   1. S/p vaginal delivery 05/23/2020 2. Uncomplicated postpartum period 3. Patient prefers to maintain Micronor for contraception, bridge to partner vasectomy 4. A2GDM on Metformin. Needs postpartum GTT, to schedule 5. Language barrier: AMN interpreter utilized for patient interaction, in-person interpreter not available  Plan:   Essential components of care per ACOG recommendations:  1.  Mood and well being: Patient with negative depression screening today. Reviewed local resources for support.  - Patient does not use tobacco.  - hx of drug use? No    2. Infant care and feeding:  -Patient currently breastmilk feeding? No  -Social determinants of health (SDOH) reviewed in EPIC.  No concerns  3. Sexuality, contraception and birth spacing - Patient does not want a pregnancy in the next year.   - Reviewed forms of contraception in tiered fashion. Patient desired oral progesterone-only contraceptive today.  Prescribed at hospital discharge. Confirmed patient has available refills  4. Sleep and fatigue -Encouraged family/partner/community support of 4 hrs of uninterrupted sleep to help with mood and fatigue  5. Physical Recovery  - Discussed patients delivery and complications - Patient had a Vaginal, no problems at delivery. Perineal healing reviewed. Patient expressed understanding - Patient has urinary incontinence? No - Patient is safe to resume physical and sexual activity  6.  Health Maintenance - HM due items addressed Yes - Pap due, patient aware and  declines today - Mammogram due, patient aware  7. Chronic Disease - Needs PCP, declined referral to [facility]   Clayton Bibles, MSN, CNM Certified Nurse Midwife, [employer] for [employer], [facility] 06/20/20 2:35 PM

## 2020-06-20 NOTE — Patient Instructions (Signed)
Estreimiento, en adultos Constipation, Adult El estreimiento se produce cuando una persona tiene problemas para defecar (hacer sus deposiciones). Cuando tiene esta afeccin, el nmero de deposiciones es inferior a 3 veces por semana. Las deposiciones (heces) tambin pueden ser secas, duras o ms voluminosas de lo normal. Siga estas instrucciones en su casa: Comida y bebida  Consuma alimentos con alto contenido de fibra, por ejemplo: ? Frutas y verduras frescas. ? Cereales integrales. ? Frijoles.  Consuma menos alimentos bajos en fibra y con alto contenido de grasas y azcar, como: ? Papas fritas. ? Hamburguesas. ? Galletas dulces. ? Caramelos. ? Gaseosas.  Beba suficiente lquido para mantener el pis (orina) de color amarillo plido.   Instrucciones generales  Haga actividad fsica con regularidad o segn las indicaciones del mdico. Intente practicar 150minutos de actividad fsica por semana.  Vaya al bao cuando sienta la necesidad de defecar. No se aguante las ganas.  Use los medicamentos de venta libre y los recetados solamente como se lo haya indicado el mdico. Estos incluyen los suplementos de fibra.  Cuando est defecando: ? Respire profundamente mientras relaja la parte inferior del vientre (abdomen). ? Relaje el suelo plvico. El suelo plvico est formado por un grupo de msculos que sostienen el recto, la vejiga y los intestinos (as como el tero en las mujeres).  Controle su afeccin para detectar cualquier cambio. Visite al mdico si advierte lo indicado.  Concurra a todas las visitas de seguimiento como se lo haya indicado el mdico. Esto es importante. Comunquese con un mdico si:  Su dolor empeora.  Tiene fiebre.  No ha defecado por 4das.  Vomita.  No tiene hambre.  Pierde peso.  Tiene sangrado por la abertura entre las nalgas (ano).  Las deposiciones son delgadas como un lpiz. Solicite ayuda de inmediato si:  Tiene fiebre, y los sntomas  empeoran de repente.  Tiene prdida de materia fecal u observa sangre en las heces.  Siente el vientre ms duro o ms grande de lo normal (hinchado).  Siente un dolor muy intenso en el vientre.  Se siente mareado o se desmaya. Resumen  El estreimiento se produce cuando una persona defeca menos de 3 veces a la semana, tiene problemas para defecar o las heces son secas, duras o ms grandes que lo normal.  Consuma alimentos con un alto contenido de fibra.  Beba suficiente lquido para mantener el pis (orina) de color amarillo plido.  Use los medicamentos de venta libre y los recetados solamente como se lo haya indicado el mdico. Estos incluyen los suplementos de fibra. Esta informacin no tiene como fin reemplazar el consejo del mdico. Asegrese de hacerle al mdico cualquier pregunta que tenga. Document Revised: 03/20/2019 Document Reviewed: 03/20/2019 Elsevier Patient Education  2021 [employer]  

## 2020-06-30 ENCOUNTER — Encounter: Payer: Self-pay | Admitting: General Practice

## 2020-07-18 ENCOUNTER — Other Ambulatory Visit: Payer: Self-pay | Admitting: General Practice

## 2020-07-18 DIAGNOSIS — O24415 Gestational diabetes mellitus in pregnancy, controlled by oral hypoglycemic drugs: Secondary | ICD-10-CM

## 2020-07-20 ENCOUNTER — Other Ambulatory Visit: Payer: Self-pay

## 2021-07-30 IMAGING — US US FETAL BPP W/ NON-STRESS
1 series · 13 of 13 positions shown · non-contrast
Comparison: none

[Series 1: us fetal bpp w/ non-stress · 13 acquisitions, 13 frames shown]
[im 1/13]
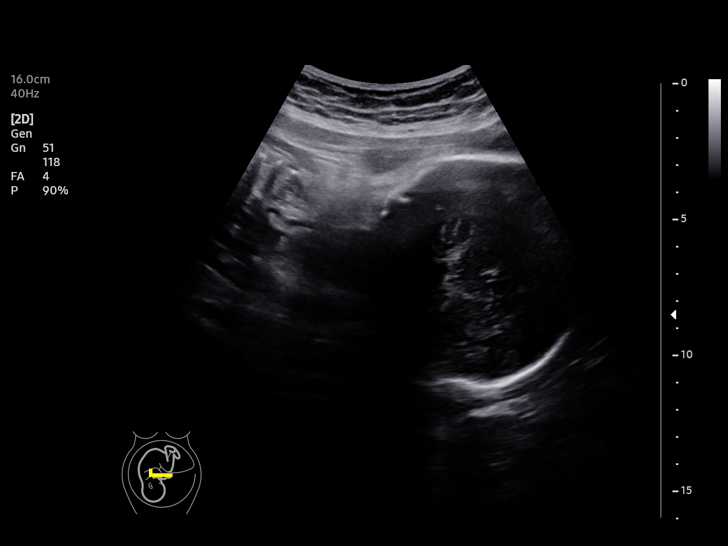
[im 2/13]
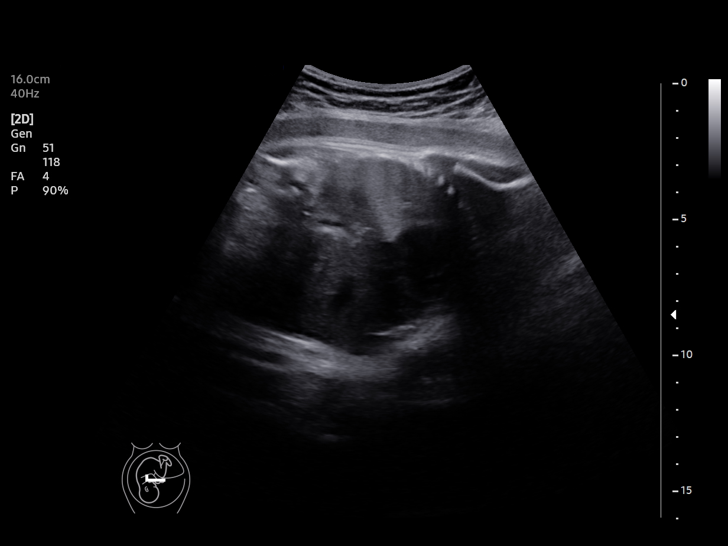
[im 3/13]
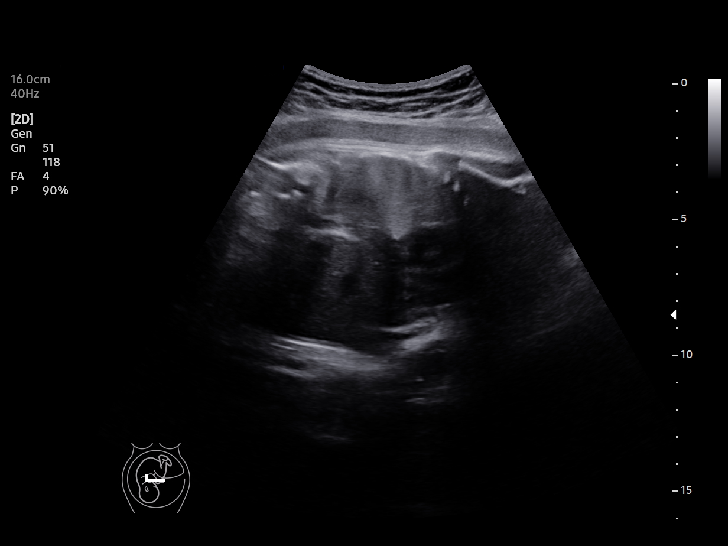
[im 4/13]
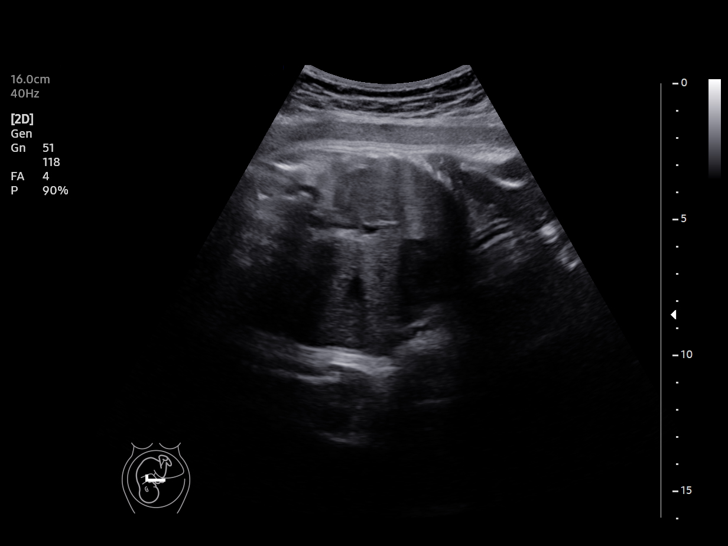
[im 5/13]
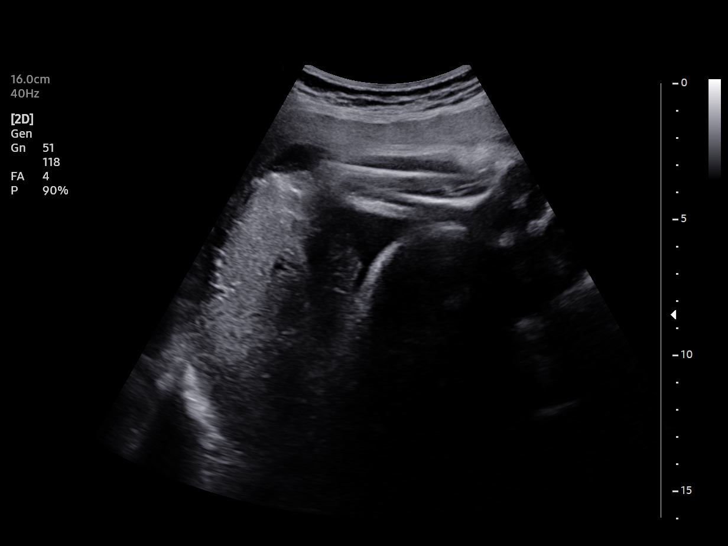
[im 6/13]
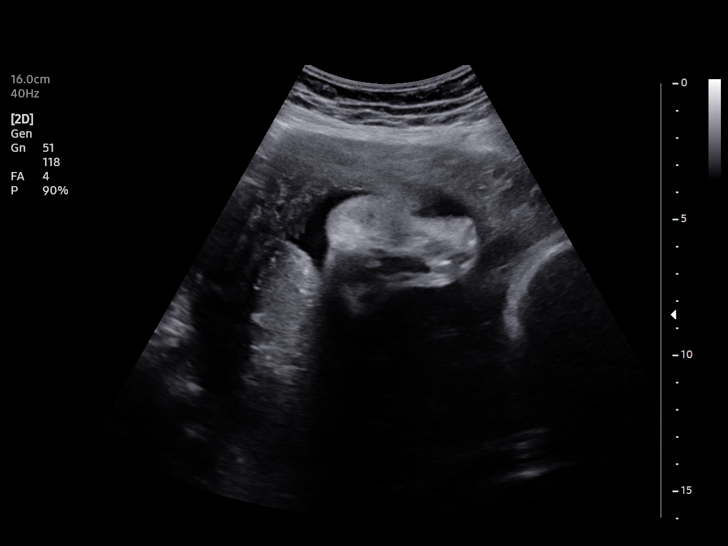
[im 7/13]
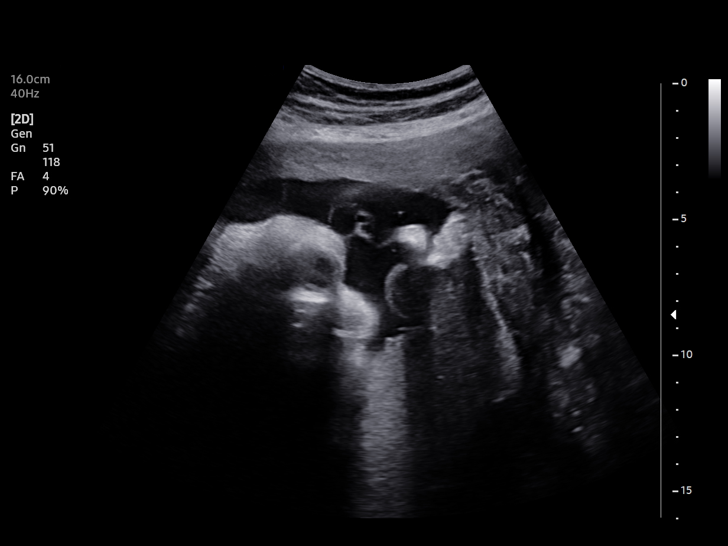
[im 8/13]
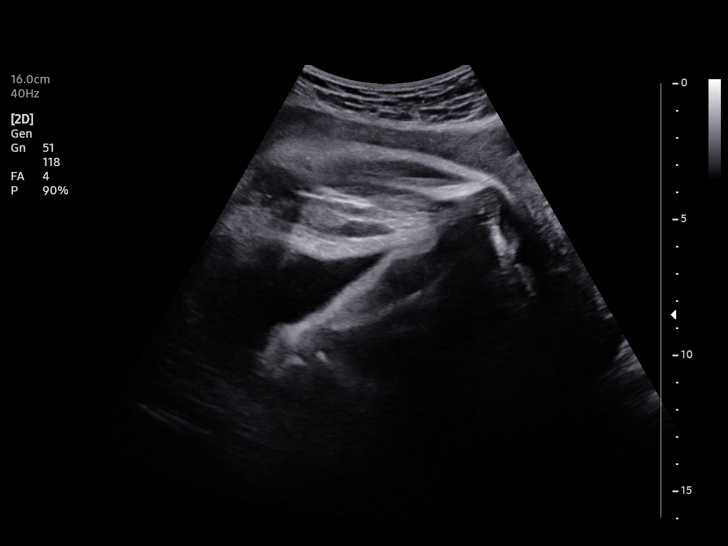
[im 9/13]
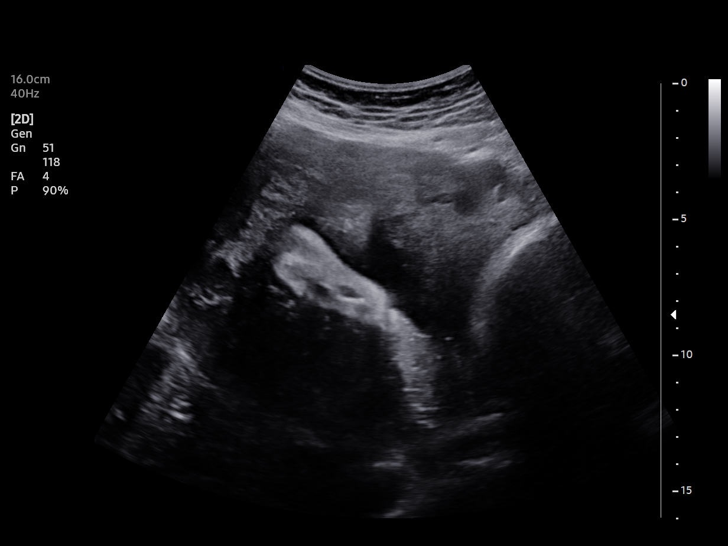
[im 10/13]
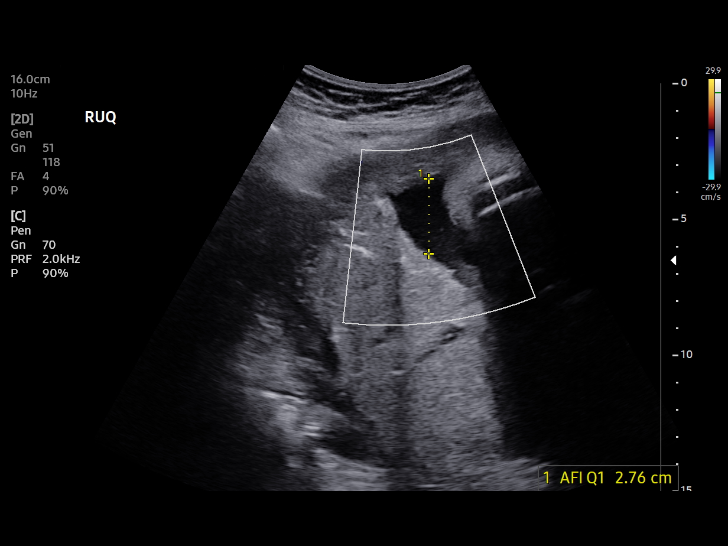
[im 11/13]
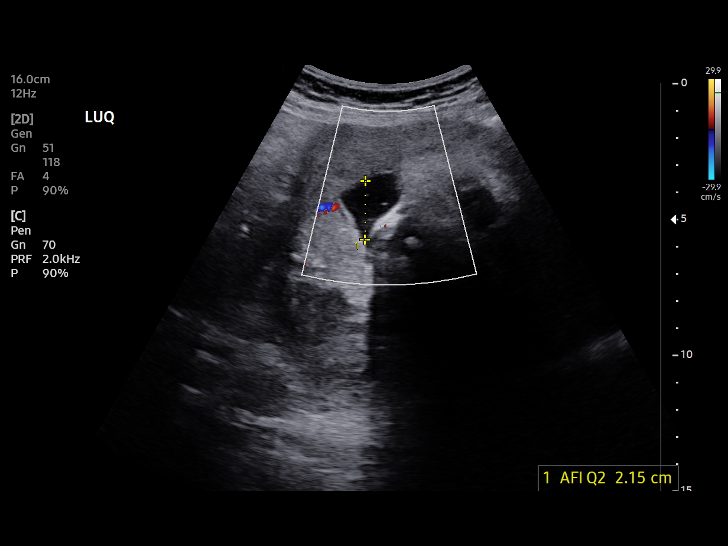
[im 12/13]
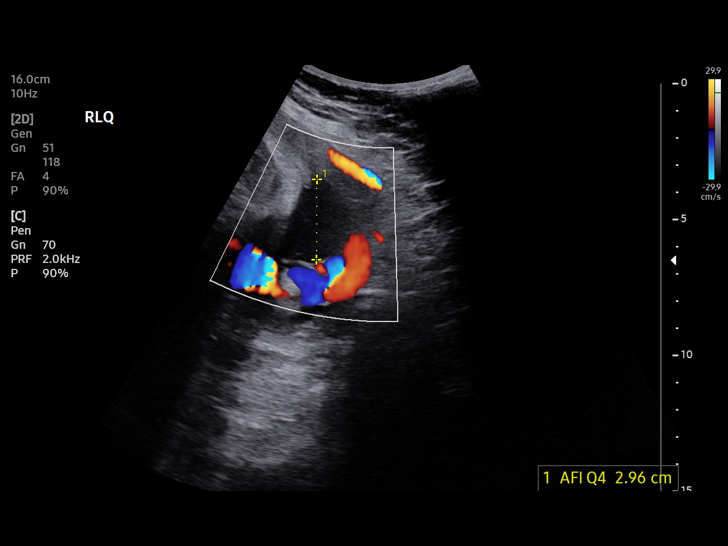
[im 13/13]
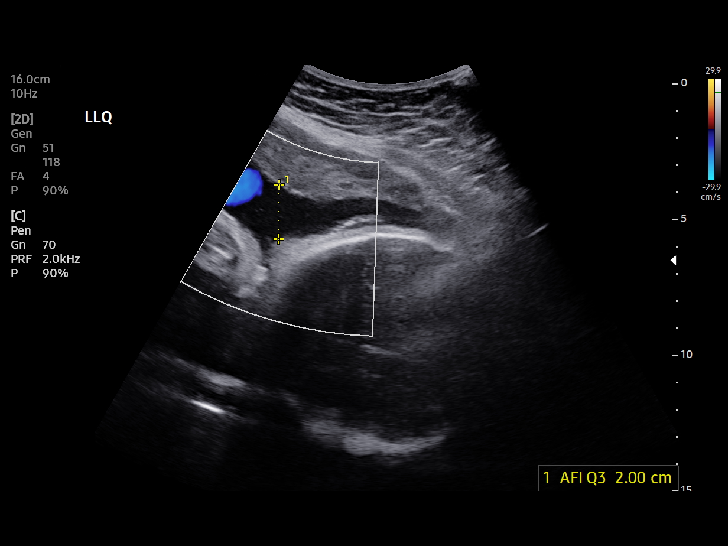

[13 of 13 positions shown; findings below may reference images not displayed]

[REDACTED]
 Referred By:      SORIN               [facility]

 1  US FETAL BPP W/NONSTRESS              [address].4     ENI SERRATA

Indications

 Weeks of gestation of pregnancy not
 specified
 Gestational diabetes in pregnancy,
 controlled by oral hypoglycemic drugs
Fetal Evaluation

 Num Of Fetuses:          1
 Preg. Location:          Intrauterine
 Cardiac Activity:        Observed
 Fetal Lie:               Maternal right side
 Presentation:            Cephalic

 Amniotic Fluid
 AFI FV:      Within normal limits

 AFI Sum(cm)                 Largest Pocket(cm)


 RUQ(cm)       RLQ(cm)       LUQ(cm)        LLQ(cm)
 2.76          2.96          2.15           2
Biophysical Evaluation

 Amniotic F.V:   Pocket => 2 cm             F. Tone:         Observed
 F. Movement:    Observed                   N.S.T:           Reactive
 F. Breathing:   Observed                   Score:           [DATE]
OB History

 Gravidity:    3         Term:   2
 Living:       2
Impression

 Antenatal testing is reassuring with BPP [DATE].  Normal
 amniotic fluid volume.
Recommendations

 Continue weekly antenatal testing till delivery .
              PIM SAMUEL CKOUDE

## 2021-08-06 IMAGING — US US FETAL BPP W/ NON-STRESS
1 series · 13 of 17 positions shown · non-contrast
Comparison: none

[Series 1: us fetal bpp w/ non-stress · 17 acquisitions, 13 frames shown]
[im 1/17]
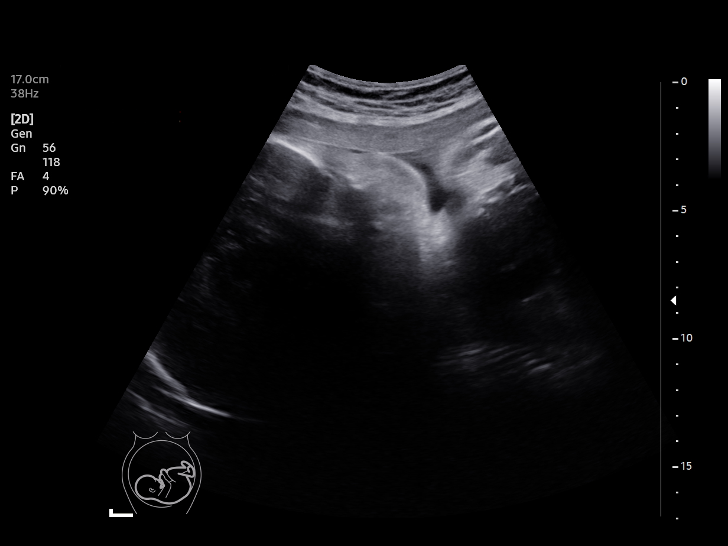
[im 2/17]
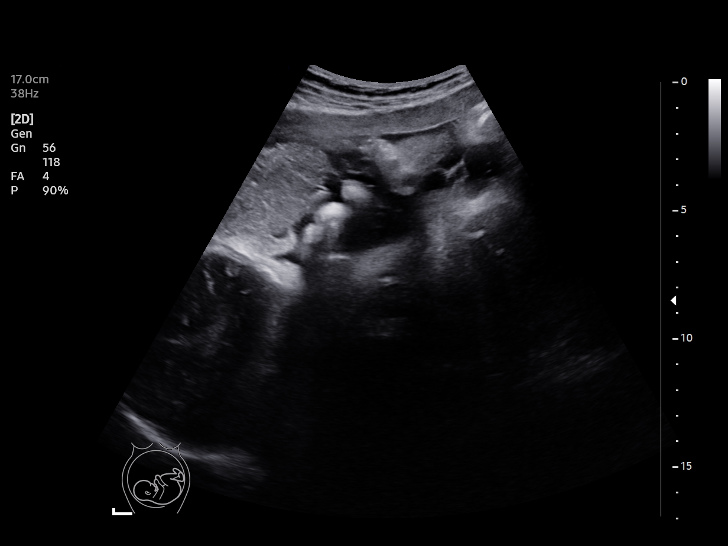
[im 4/17]
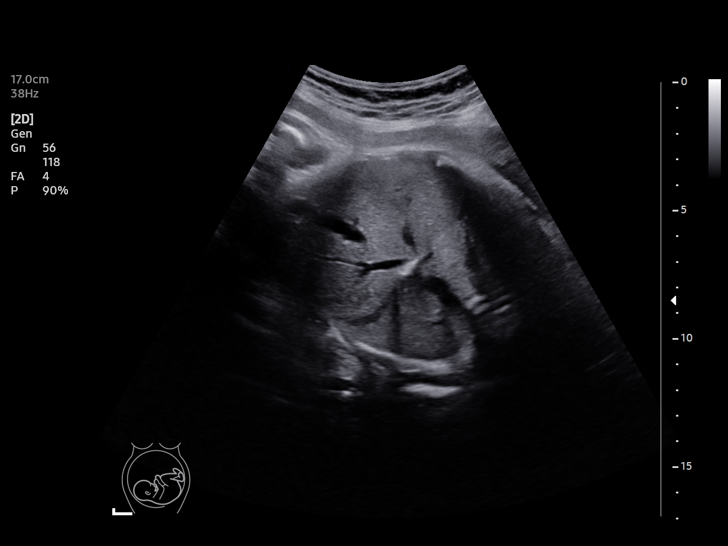
[im 5/17]
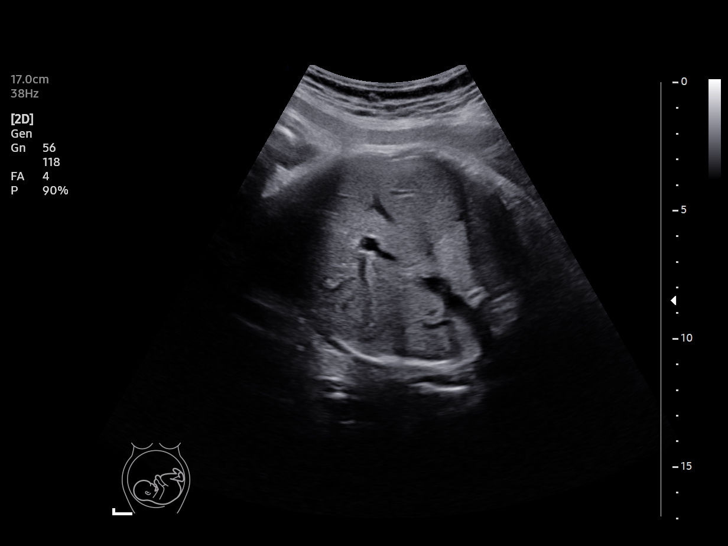
[im 6/17]
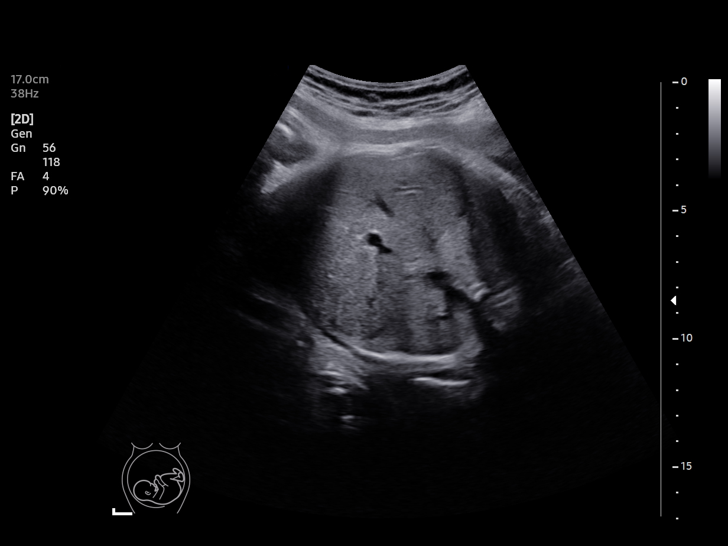
[im 8/17]
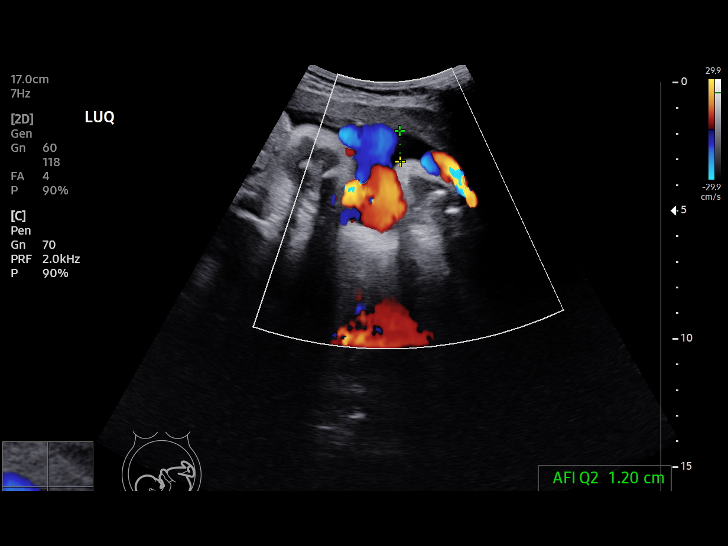
[im 9/17]
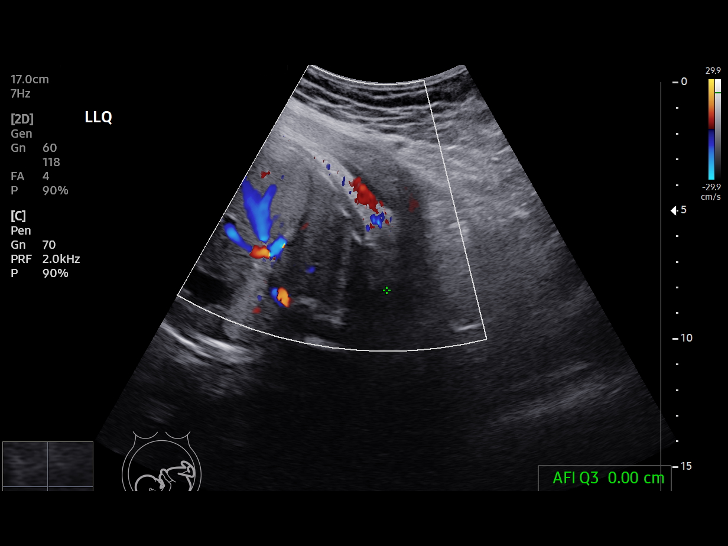
[im 10/17]
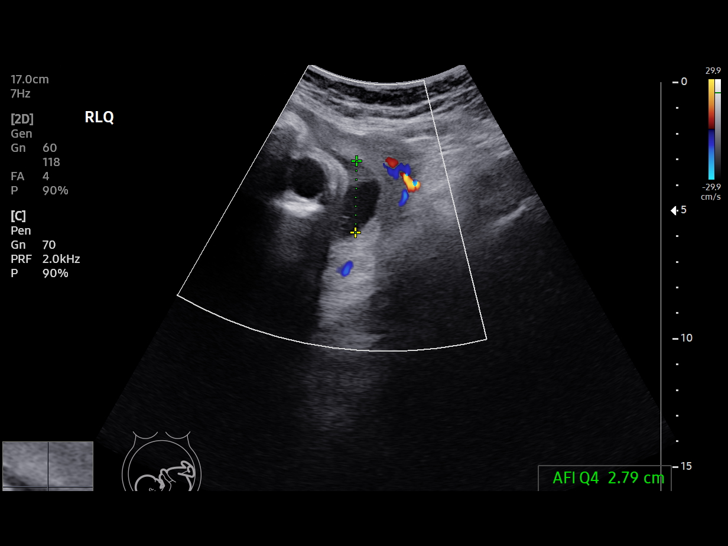
[im 12/17]
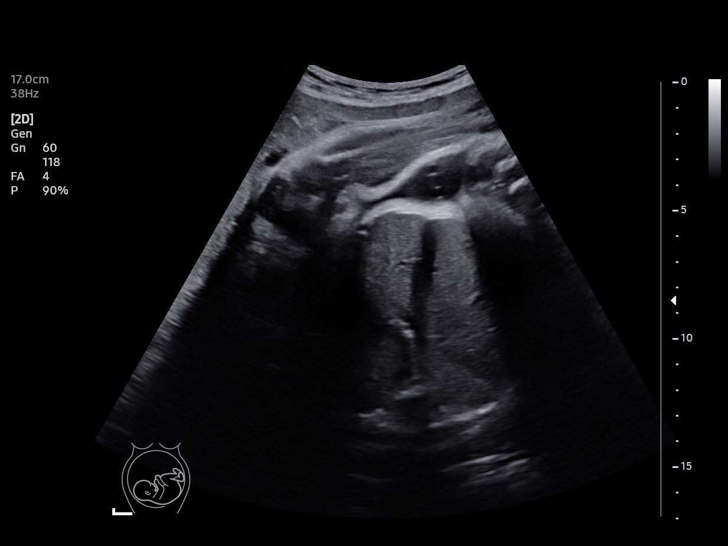
[im 13/17]
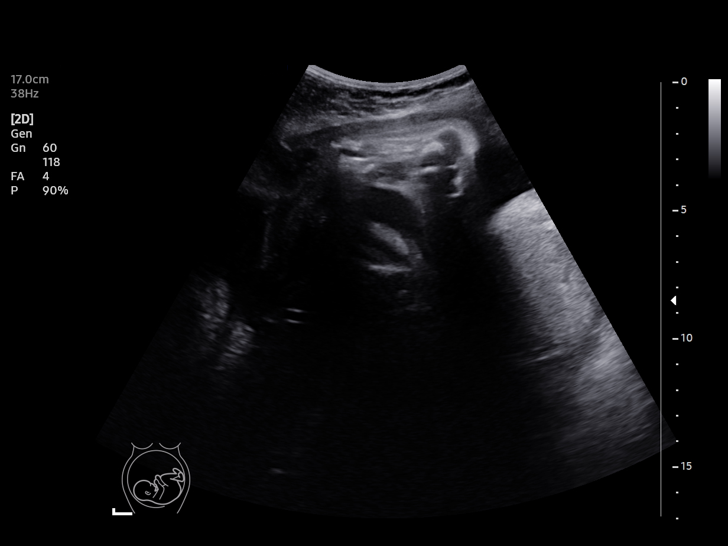
[im 14/17]
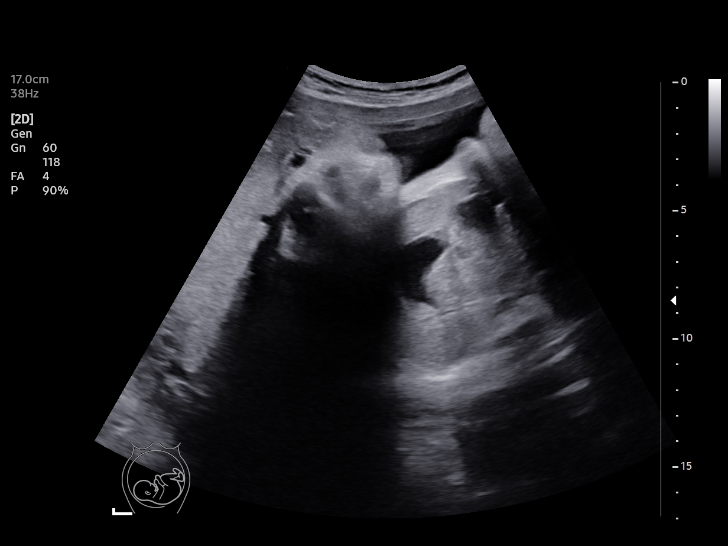
[im 16/17]
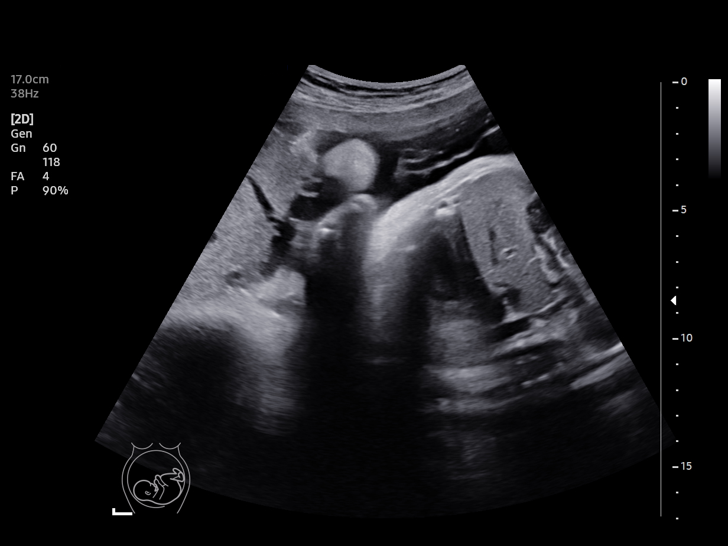
[im 17/17]
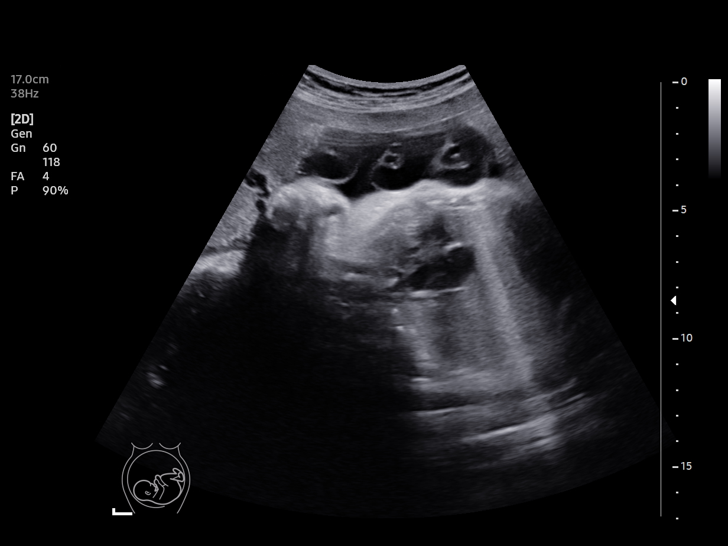

[13 of 17 positions shown; findings below may reference images not displayed]

[REDACTED]
 Referred By:      VIVIANNE               [facility]

 1  US FETAL BPP W/NONSTRESS              [address].4     KAYY TURGOOSE

Service(s) Provided

Indications

 Weeks of gestation of pregnancy not
 specified
 Gestational diabetes in pregnancy,
 controlled by oral hypoglycemic drugs
 Advanced maternal age multigravida 35+,
 third trimester
Fetal Evaluation

 Num Of Fetuses:         1
 Preg. Location:         Intrauterine
 Cardiac Activity:       Observed
 Presentation:           Transverse, head to maternal right

 Amniotic Fluid
 AFI FV:      Subjectively low-normal

 AFI Sum(cm)                 Largest Pocket(cm)


 RUQ(cm)       RLQ(cm)       LUQ(cm)        LLQ(cm)
 4.93          2.79          1.2            0
Biophysical Evaluation

 Amniotic F.V:   Pocket => 2 cm             F. Tone:        Observed
 F. Movement:    Observed                   N.S.T:          Reactive
 F. Breathing:   Observed                   Score:          [DATE]
OB History

 Gravidity:    3         Term:   2
 Living:       2
Impression

 BPP [DATE]
Recommendations

 -Continue weekly BPP till delivery.
                  VASU ST JEAN

## 2023-07-25 ENCOUNTER — Other Ambulatory Visit: Payer: Self-pay | Admitting: Nurse Practitioner

## 2023-07-25 DIAGNOSIS — R42 Dizziness and giddiness: Secondary | ICD-10-CM
